# Patient Record
Sex: Female | Born: 1935 | Race: White | Hispanic: No
Health system: Southern US, Community
[De-identification: ages and names within clinical notes are randomized; demographics above are authoritative.]

---

## 2004-04-14 ENCOUNTER — Ambulatory Visit: Payer: Self-pay | Admitting: Family Medicine

## 2004-04-25 ENCOUNTER — Ambulatory Visit: Payer: Self-pay | Admitting: Family Medicine

## 2004-12-12 ENCOUNTER — Ambulatory Visit: Payer: Self-pay | Admitting: Family Medicine

## 2005-09-13 ENCOUNTER — Ambulatory Visit: Payer: Self-pay | Admitting: Pain Medicine

## 2005-09-27 ENCOUNTER — Ambulatory Visit: Payer: Self-pay | Admitting: Pain Medicine

## 2005-10-04 ENCOUNTER — Ambulatory Visit: Payer: Self-pay | Admitting: Pain Medicine

## 2005-11-01 ENCOUNTER — Ambulatory Visit: Payer: Self-pay | Admitting: Pain Medicine

## 2005-11-16 ENCOUNTER — Ambulatory Visit: Payer: Self-pay | Admitting: Pain Medicine

## 2005-11-30 ENCOUNTER — Ambulatory Visit: Payer: Self-pay | Admitting: Physician Assistant

## 2006-02-02 ENCOUNTER — Ambulatory Visit: Payer: Self-pay | Admitting: Internal Medicine

## 2007-03-28 ENCOUNTER — Ambulatory Visit: Payer: Self-pay | Admitting: Family Medicine

## 2007-04-15 ENCOUNTER — Other Ambulatory Visit: Payer: Self-pay

## 2007-04-16 ENCOUNTER — Inpatient Hospital Stay: Payer: Self-pay | Admitting: Surgery

## 2007-04-18 ENCOUNTER — Other Ambulatory Visit: Payer: Self-pay

## 2008-02-24 ENCOUNTER — Ambulatory Visit: Payer: Self-pay | Admitting: Pain Medicine

## 2008-03-05 ENCOUNTER — Ambulatory Visit: Payer: Self-pay | Admitting: Pain Medicine

## 2008-03-24 ENCOUNTER — Ambulatory Visit: Payer: Self-pay | Admitting: Physician Assistant

## 2008-03-30 ENCOUNTER — Ambulatory Visit: Payer: Self-pay | Admitting: Internal Medicine

## 2008-04-09 ENCOUNTER — Ambulatory Visit: Payer: Self-pay | Admitting: Pain Medicine

## 2008-04-30 ENCOUNTER — Ambulatory Visit: Payer: Self-pay | Admitting: Pain Medicine

## 2008-05-18 ENCOUNTER — Ambulatory Visit: Payer: Self-pay | Admitting: Pain Medicine

## 2008-05-28 ENCOUNTER — Ambulatory Visit: Payer: Self-pay | Admitting: Pain Medicine

## 2008-06-09 ENCOUNTER — Ambulatory Visit: Payer: Self-pay | Admitting: Physician Assistant

## 2008-06-22 ENCOUNTER — Ambulatory Visit: Payer: Self-pay | Admitting: Pain Medicine

## 2008-07-06 ENCOUNTER — Ambulatory Visit: Payer: Self-pay | Admitting: Physician Assistant

## 2008-08-12 ENCOUNTER — Ambulatory Visit: Payer: Self-pay | Admitting: Physician Assistant

## 2009-04-01 ENCOUNTER — Ambulatory Visit: Payer: Self-pay | Admitting: Internal Medicine

## 2010-06-29 ENCOUNTER — Ambulatory Visit: Payer: Self-pay

## 2010-10-19 ENCOUNTER — Ambulatory Visit: Payer: Self-pay | Admitting: Internal Medicine

## 2011-07-02 ENCOUNTER — Emergency Department: Payer: Self-pay | Admitting: Emergency Medicine

## 2011-07-02 ENCOUNTER — Ambulatory Visit: Payer: Self-pay | Admitting: Family Medicine

## 2011-07-02 LAB — URINALYSIS, COMPLETE
Bilirubin,UR: NEGATIVE
Glucose,UR: NEGATIVE mg/dL (ref 0–75)
Ketone: NEGATIVE
Nitrite: NEGATIVE
Ph: 8 (ref 4.5–8.0)
Protein: NEGATIVE
Specific Gravity: 1.005 (ref 1.003–1.030)
Squamous Epithelial: 1

## 2011-07-02 LAB — CBC
HCT: 40.4 % (ref 35.0–47.0)
MCH: 30.8 pg (ref 26.0–34.0)
MCV: 93 fL (ref 80–100)
Platelet: 264 10*3/uL (ref 150–440)
RBC: 4.37 10*6/uL (ref 3.80–5.20)
WBC: 6.5 10*3/uL (ref 3.6–11.0)

## 2011-07-02 LAB — COMPREHENSIVE METABOLIC PANEL
Albumin: 3.6 g/dL (ref 3.4–5.0)
Alkaline Phosphatase: 950 U/L — ABNORMAL HIGH (ref 50–136)
Anion Gap: 9 (ref 7–16)
Calcium, Total: 8.9 mg/dL (ref 8.5–10.1)
Co2: 28 mmol/L (ref 21–32)
EGFR (African American): >60
EGFR (Non-African Amer.): 52 — ABNORMAL LOW
Glucose: 104 mg/dL — ABNORMAL HIGH (ref 65–99)
Osmolality: 269 (ref 275–301)
Potassium: 3.7 mmol/L (ref 3.5–5.1)
SGOT(AST): 62 U/L — ABNORMAL HIGH (ref 15–37)
Sodium: 133 mmol/L — ABNORMAL LOW (ref 136–145)
Total Protein: 7.4 g/dL (ref 6.4–8.2)

## 2011-07-02 LAB — TROPONIN I: Troponin-I: <0.02 ng/mL

## 2011-08-12 ENCOUNTER — Ambulatory Visit: Payer: Self-pay | Admitting: Internal Medicine

## 2011-09-05 ENCOUNTER — Ambulatory Visit: Payer: Self-pay | Admitting: Physical Medicine and Rehabilitation

## 2011-09-07 ENCOUNTER — Ambulatory Visit: Payer: Self-pay | Admitting: Internal Medicine

## 2011-09-11 ENCOUNTER — Ambulatory Visit: Payer: Self-pay | Admitting: Hematology and Oncology

## 2011-09-20 ENCOUNTER — Ambulatory Visit: Payer: Self-pay | Admitting: Ophthalmology

## 2011-09-25 ENCOUNTER — Ambulatory Visit: Payer: Self-pay | Admitting: Hematology and Oncology

## 2011-09-25 ENCOUNTER — Ambulatory Visit: Payer: Self-pay

## 2011-09-25 LAB — CBC CANCER CENTER
Basophil %: 0.8 %
Eosinophil %: 2.3 %
HCT: 37.4 % (ref 35.0–47.0)
HGB: 12.2 g/dL (ref 12.0–16.0)
Lymphocyte %: 23.5 %
Monocyte #: 0.6 x10 3/mm (ref 0.2–0.9)
Monocyte %: 8.4 %
Neutrophil #: 4.5 x10 3/mm (ref 1.4–6.5)
Neutrophil %: 65 %
RBC: 3.99 10*6/uL (ref 3.80–5.20)
WBC: 7 x10 3/mm (ref 3.6–11.0)

## 2011-09-25 LAB — COMPREHENSIVE METABOLIC PANEL
Anion Gap: 5 — ABNORMAL LOW (ref 7–16)
Calcium, Total: 9.1 mg/dL (ref 8.5–10.1)
Chloride: 100 mmol/L (ref 98–107)
Co2: 34 mmol/L — ABNORMAL HIGH (ref 21–32)
EGFR (African American): 48 — ABNORMAL LOW
EGFR (Non-African Amer.): 42 — ABNORMAL LOW
Osmolality: 284 (ref 275–301)
Potassium: 4.2 mmol/L (ref 3.5–5.1)
Sodium: 139 mmol/L (ref 136–145)

## 2011-09-26 LAB — KAPPA/LAMBDA FREE LIGHT CHAINS (ARMC)

## 2011-09-28 LAB — PROT IMMUNOELECT,UR-24HR

## 2011-10-04 ENCOUNTER — Emergency Department: Payer: Self-pay | Admitting: Emergency Medicine

## 2011-10-07 ENCOUNTER — Emergency Department: Payer: Self-pay | Admitting: Emergency Medicine

## 2011-10-07 LAB — COMPREHENSIVE METABOLIC PANEL
Albumin: 2.8 g/dL — ABNORMAL LOW (ref 3.4–5.0)
Alkaline Phosphatase: 592 U/L — ABNORMAL HIGH (ref 50–136)
Bilirubin,Total: 0.7 mg/dL (ref 0.2–1.0)
Calcium, Total: 8.2 mg/dL — ABNORMAL LOW (ref 8.5–10.1)
Co2: 30 mmol/L (ref 21–32)
Creatinine: 1.87 mg/dL — ABNORMAL HIGH (ref 0.60–1.30)
EGFR (Non-African Amer.): 24 — ABNORMAL LOW
Glucose: 134 mg/dL — ABNORMAL HIGH (ref 65–99)
SGPT (ALT): 20 U/L (ref 12–78)

## 2011-10-07 LAB — CBC
HCT: 31.1 % — ABNORMAL LOW (ref 35.0–47.0)
MCHC: 33.6 g/dL (ref 32.0–36.0)
MCV: 91 fL (ref 80–100)
RDW: 13.7 % (ref 11.5–14.5)

## 2011-10-09 LAB — COMPREHENSIVE METABOLIC PANEL
Albumin: 2.7 g/dL — ABNORMAL LOW (ref 3.4–5.0)
Alkaline Phosphatase: 725 U/L — ABNORMAL HIGH (ref 50–136)
Bilirubin,Total: 0.6 mg/dL (ref 0.2–1.0)
Calcium, Total: 8.3 mg/dL — ABNORMAL LOW (ref 8.5–10.1)
Co2: 29 mmol/L (ref 21–32)
Glucose: 108 mg/dL — ABNORMAL HIGH (ref 65–99)
SGOT(AST): 46 U/L — ABNORMAL HIGH (ref 15–37)

## 2011-10-09 LAB — APTT: Activated PTT: 28 secs (ref 23.6–35.9)

## 2011-10-09 LAB — TSH: Thyroid Stimulating Horm: 1.17 u[IU]/mL

## 2011-10-09 LAB — CBC WITH DIFFERENTIAL/PLATELET
Basophil #: 0.1 10*3/uL (ref 0.0–0.1)
Eosinophil #: 0 10*3/uL (ref 0.0–0.7)
HCT: 30.5 % — ABNORMAL LOW (ref 35.0–47.0)
HGB: 9.9 g/dL — ABNORMAL LOW (ref 12.0–16.0)
Lymphocyte %: 12.8 %
MCH: 29.4 pg (ref 26.0–34.0)
MCHC: 32.4 g/dL (ref 32.0–36.0)
Monocyte #: 1 x10 3/mm — ABNORMAL HIGH (ref 0.2–0.9)
Monocyte %: 9.2 %
Neutrophil #: 8.4 10*3/uL — ABNORMAL HIGH (ref 1.4–6.5)
RBC: 3.36 10*6/uL — ABNORMAL LOW (ref 3.80–5.20)
WBC: 10.9 10*3/uL (ref 3.6–11.0)

## 2011-10-09 LAB — MAGNESIUM: Magnesium: 2.1 mg/dL

## 2011-10-09 LAB — PROTIME-INR: INR: 1

## 2011-10-10 LAB — BASIC METABOLIC PANEL
Calcium, Total: 8.3 mg/dL — ABNORMAL LOW (ref 8.5–10.1)
Chloride: 105 mmol/L (ref 98–107)
Co2: 24 mmol/L (ref 21–32)
Creatinine: 1.07 mg/dL (ref 0.60–1.30)
EGFR (African American): 55 — ABNORMAL LOW
EGFR (Non-African Amer.): 47 — ABNORMAL LOW
Osmolality: 285 (ref 275–301)
Potassium: 3.8 mmol/L (ref 3.5–5.1)
Sodium: 139 mmol/L (ref 136–145)

## 2011-10-10 LAB — URINALYSIS, COMPLETE
Bacteria: NONE SEEN
Blood: NEGATIVE
Glucose,UR: NEGATIVE mg/dL (ref 0–75)
Leukocyte Esterase: NEGATIVE
Nitrite: NEGATIVE
Protein: NEGATIVE
RBC,UR: NONE SEEN /HPF (ref 0–5)
Specific Gravity: 1.012 (ref 1.003–1.030)
WBC UR: NONE SEEN /HPF (ref 0–5)

## 2011-10-12 ENCOUNTER — Inpatient Hospital Stay: Payer: Self-pay | Admitting: Internal Medicine

## 2011-10-12 LAB — BASIC METABOLIC PANEL
BUN: 23 mg/dL — ABNORMAL HIGH (ref 7–18)
Calcium, Total: 7.7 mg/dL — ABNORMAL LOW (ref 8.5–10.1)
Creatinine: 0.97 mg/dL (ref 0.60–1.30)
EGFR (African American): >60
EGFR (Non-African Amer.): 53 — ABNORMAL LOW
Potassium: 4.4 mmol/L (ref 3.5–5.1)
Sodium: 140 mmol/L (ref 136–145)

## 2011-10-12 LAB — CBC WITH DIFFERENTIAL/PLATELET
Basophil #: 0.1 10*3/uL (ref 0.0–0.1)
Basophil %: 0.6 %
Eosinophil #: 0.4 10*3/uL (ref 0.0–0.7)
Lymphocyte #: 2 10*3/uL (ref 1.0–3.6)
MCH: 30.2 pg (ref 26.0–34.0)
MCV: 92 fL (ref 80–100)
Neutrophil %: 66 %
Platelet: 508 10*3/uL — ABNORMAL HIGH (ref 150–440)
RDW: 13.8 % (ref 11.5–14.5)

## 2011-10-16 ENCOUNTER — Encounter: Payer: Self-pay | Admitting: Internal Medicine

## 2011-10-17 ENCOUNTER — Ambulatory Visit: Payer: Self-pay | Admitting: Hematology and Oncology

## 2011-10-17 ENCOUNTER — Encounter: Payer: Self-pay | Admitting: Internal Medicine

## 2011-10-30 LAB — CBC CANCER CENTER
Basophil #: 0.1 x10 3/mm (ref 0.0–0.1)
Eosinophil #: 0.1 x10 3/mm (ref 0.0–0.7)
Eosinophil %: 1.2 %
HCT: 33.6 % — ABNORMAL LOW (ref 35.0–47.0)
Lymphocyte #: 1.2 x10 3/mm (ref 1.0–3.6)
MCH: 30.4 pg (ref 26.0–34.0)
MCHC: 32.5 g/dL (ref 32.0–36.0)
MCV: 94 fL (ref 80–100)
Monocyte #: 0.4 x10 3/mm (ref 0.2–0.9)
Neutrophil #: 4.5 x10 3/mm (ref 1.4–6.5)
Neutrophil %: 72.5 %
Platelet: 359 x10 3/mm (ref 150–440)
RBC: 3.59 10*6/uL — ABNORMAL LOW (ref 3.80–5.20)
RDW: 15.1 % — ABNORMAL HIGH (ref 11.5–14.5)

## 2011-10-30 LAB — COMPREHENSIVE METABOLIC PANEL
Alkaline Phosphatase: 661 U/L — ABNORMAL HIGH (ref 50–136)
BUN: 23 mg/dL — ABNORMAL HIGH (ref 7–18)
Bilirubin,Total: 0.2 mg/dL (ref 0.2–1.0)
Chloride: 104 mmol/L (ref 98–107)
Creatinine: 1.01 mg/dL (ref 0.60–1.30)
EGFR (African American): 58 — ABNORMAL LOW
Glucose: 148 mg/dL — ABNORMAL HIGH (ref 65–99)
Potassium: 4.6 mmol/L (ref 3.5–5.1)
SGOT(AST): 41 U/L — ABNORMAL HIGH (ref 15–37)
SGPT (ALT): 23 U/L (ref 12–78)
Total Protein: 6.1 g/dL — ABNORMAL LOW (ref 6.4–8.2)

## 2011-11-17 ENCOUNTER — Ambulatory Visit: Payer: Self-pay | Admitting: Hematology and Oncology

## 2011-11-27 LAB — COMPREHENSIVE METABOLIC PANEL WITH GFR
Albumin: 3.2 g/dL — ABNORMAL LOW
Alkaline Phosphatase: 391 U/L — ABNORMAL HIGH
Anion Gap: 9
BUN: 29 mg/dL — ABNORMAL HIGH
Bilirubin,Total: 0.2 mg/dL
Calcium, Total: 8.3 mg/dL — ABNORMAL LOW
Chloride: 102 mmol/L
Co2: 31 mmol/L
Creatinine: 1.05 mg/dL
EGFR (African American): 56 — ABNORMAL LOW
EGFR (Non-African Amer.): 48 — ABNORMAL LOW
Glucose: 98 mg/dL
Osmolality: 289
Potassium: 3.3 mmol/L — ABNORMAL LOW
SGOT(AST): 46 U/L — ABNORMAL HIGH
SGPT (ALT): 21 U/L
Sodium: 142 mmol/L
Total Protein: 6.8 g/dL

## 2011-11-27 LAB — CBC CANCER CENTER
Basophil #: 0 10*3/uL
Basophil %: 0.7 %
Eosinophil #: 0 10*3/uL
Eosinophil %: 0.7 %
HCT: 35.8 %
HGB: 11.6 g/dL — ABNORMAL LOW
Lymphocyte %: 22.6 %
Lymphs Abs: 1.4 10*3/uL
MCH: 29.3 pg
MCHC: 32.5 g/dL
MCV: 90 fL
Monocyte #: 0.4 10*3/uL
Monocyte %: 7.2 %
Neutrophil #: 4.2 10*3/uL
Neutrophil %: 68.8 %
Platelet: 268 10*3/uL
RBC: 3.97 10*6/uL
RDW: 14.7 % — ABNORMAL HIGH
WBC: 6.2 10*3/uL

## 2011-12-17 ENCOUNTER — Ambulatory Visit: Payer: Self-pay | Admitting: Hematology and Oncology

## 2011-12-19 ENCOUNTER — Ambulatory Visit: Payer: Self-pay | Admitting: Hematology and Oncology

## 2011-12-19 LAB — LACTATE DEHYDROGENASE, PLEURAL OR PERITONEAL FLUID: LDH, Body Fluid: 276 U/L

## 2011-12-19 LAB — PROTIME-INR: Prothrombin Time: 13.8 secs (ref 11.5–14.7)

## 2011-12-19 LAB — AMYLASE, BODY FLUID: Amylase, Body Fluid: 18 U/L

## 2012-01-01 ENCOUNTER — Inpatient Hospital Stay: Payer: Self-pay | Admitting: Internal Medicine

## 2012-01-01 LAB — CBC CANCER CENTER
Basophil #: 0 x10 3/mm (ref 0.0–0.1)
Basophil %: 0.8 %
Eosinophil #: 0.2 x10 3/mm (ref 0.0–0.7)
Eosinophil %: 4.4 %
MCH: 27.5 pg (ref 26.0–34.0)
Monocyte %: 8.7 %
Neutrophil %: 73.2 %
Platelet: 303 x10 3/mm (ref 150–440)
RBC: 4.06 10*6/uL (ref 3.80–5.20)
RDW: 15.6 % — ABNORMAL HIGH (ref 11.5–14.5)
WBC: 5.6 x10 3/mm (ref 3.6–11.0)

## 2012-01-01 LAB — COMPREHENSIVE METABOLIC PANEL
Albumin: 2.6 g/dL — ABNORMAL LOW (ref 3.4–5.0)
Alkaline Phosphatase: 300 U/L — ABNORMAL HIGH (ref 50–136)
Anion Gap: 4 — ABNORMAL LOW (ref 7–16)
BUN: 26 mg/dL — ABNORMAL HIGH (ref 7–18)
Bilirubin,Total: 0.3 mg/dL (ref 0.2–1.0)
Co2: 33 mmol/L — ABNORMAL HIGH (ref 21–32)
Creatinine: 0.96 mg/dL (ref 0.60–1.30)
EGFR (Non-African Amer.): 54 — ABNORMAL LOW
Osmolality: 283 (ref 275–301)
Potassium: 3.9 mmol/L (ref 3.5–5.1)
SGPT (ALT): 18 U/L (ref 12–78)
Sodium: 139 mmol/L (ref 136–145)

## 2012-01-02 LAB — CBC WITH DIFFERENTIAL/PLATELET
Basophil %: 0.9 %
Eosinophil #: 0.4 10*3/uL (ref 0.0–0.7)
HCT: 31.1 % — ABNORMAL LOW (ref 35.0–47.0)
HGB: 10.1 g/dL — ABNORMAL LOW (ref 12.0–16.0)
Lymphocyte %: 14.7 %
MCH: 28 pg (ref 26.0–34.0)
MCHC: 32.4 g/dL (ref 32.0–36.0)
Monocyte #: 0.6 x10 3/mm (ref 0.2–0.9)
Monocyte %: 8.3 %
Neutrophil %: 70.8 %
RBC: 3.59 10*6/uL — ABNORMAL LOW (ref 3.80–5.20)
RDW: 16.3 % — ABNORMAL HIGH (ref 11.5–14.5)
WBC: 7 10*3/uL (ref 3.6–11.0)

## 2012-01-02 LAB — BASIC METABOLIC PANEL
Anion Gap: 5 — ABNORMAL LOW (ref 7–16)
BUN: 24 mg/dL — ABNORMAL HIGH (ref 7–18)
Chloride: 108 mmol/L — ABNORMAL HIGH (ref 98–107)
Co2: 28 mmol/L (ref 21–32)
Creatinine: 0.79 mg/dL (ref 0.60–1.30)
EGFR (African American): >60
Glucose: 88 mg/dL (ref 65–99)
Sodium: 141 mmol/L (ref 136–145)

## 2012-01-04 LAB — CBC WITH DIFFERENTIAL/PLATELET
Eosinophil: 6 %
HCT: 32 % — ABNORMAL LOW (ref 35.0–47.0)
Lymphocytes: 14 %
MCH: 27.6 pg (ref 26.0–34.0)
MCHC: 31.5 g/dL — ABNORMAL LOW (ref 32.0–36.0)
Monocytes: 9 %
Platelet: 251 10*3/uL (ref 150–440)
RDW: 16.8 % — ABNORMAL HIGH (ref 11.5–14.5)
Segmented Neutrophils: 70 %
Variant Lymphocyte - H1-Rlymph: 1 %

## 2012-01-04 LAB — BASIC METABOLIC PANEL
Anion Gap: 4 — ABNORMAL LOW (ref 7–16)
BUN: 25 mg/dL — ABNORMAL HIGH (ref 7–18)
Calcium, Total: 7.9 mg/dL — ABNORMAL LOW (ref 8.5–10.1)
Creatinine: 0.75 mg/dL (ref 0.60–1.30)
EGFR (African American): >60
EGFR (Non-African Amer.): >60
Osmolality: 274 (ref 275–301)
Potassium: 3.8 mmol/L (ref 3.5–5.1)
Sodium: 135 mmol/L — ABNORMAL LOW (ref 136–145)

## 2012-01-05 LAB — URINE CULTURE

## 2012-01-08 LAB — APTT: Activated PTT: 29.4 secs (ref 23.6–35.9)

## 2012-01-08 LAB — PLATELET COUNT: Platelet: 283 10*3/uL (ref 150–440)

## 2012-01-08 LAB — PROTIME-INR: INR: 0.9

## 2012-01-11 LAB — BASIC METABOLIC PANEL
Anion Gap: 5 — ABNORMAL LOW (ref 7–16)
BUN: 29 mg/dL — ABNORMAL HIGH (ref 7–18)
Calcium, Total: 7.8 mg/dL — ABNORMAL LOW (ref 8.5–10.1)
Co2: 29 mmol/L (ref 21–32)
EGFR (African American): >60
EGFR (Non-African Amer.): 55 — ABNORMAL LOW
Osmolality: 281 (ref 275–301)
Potassium: 4.5 mmol/L (ref 3.5–5.1)
Sodium: 138 mmol/L (ref 136–145)

## 2012-01-13 ENCOUNTER — Encounter: Payer: Self-pay | Admitting: Internal Medicine

## 2012-01-17 ENCOUNTER — Encounter: Payer: Self-pay | Admitting: Internal Medicine

## 2012-01-17 ENCOUNTER — Ambulatory Visit: Payer: Self-pay | Admitting: Hematology and Oncology

## 2012-02-05 LAB — BASIC METABOLIC PANEL
BUN: 25 mg/dL — ABNORMAL HIGH (ref 7–18)
Calcium, Total: 8 mg/dL — ABNORMAL LOW (ref 8.5–10.1)
Chloride: 104 mmol/L (ref 98–107)
Co2: 30 mmol/L (ref 21–32)
Creatinine: 1.26 mg/dL (ref 0.60–1.30)
EGFR (African American): 45 — ABNORMAL LOW
Osmolality: 287 (ref 275–301)
Sodium: 141 mmol/L (ref 136–145)

## 2012-02-05 LAB — CBC CANCER CENTER
Basophil %: 1.3 %
Eosinophil #: 0.7 x10 3/mm (ref 0.0–0.7)
Eosinophil %: 9.1 %
HGB: 10.4 g/dL — ABNORMAL LOW (ref 12.0–16.0)
MCH: 27.3 pg (ref 26.0–34.0)
MCHC: 32.2 g/dL (ref 32.0–36.0)
MCV: 85 fL (ref 80–100)
Monocyte #: 0.6 x10 3/mm (ref 0.2–0.9)
Monocyte %: 8.7 %
Neutrophil #: 4.9 x10 3/mm (ref 1.4–6.5)
Neutrophil %: 66.6 %
Platelet: 345 x10 3/mm (ref 150–440)
RBC: 3.82 10*6/uL (ref 3.80–5.20)

## 2012-02-17 ENCOUNTER — Ambulatory Visit: Payer: Self-pay | Admitting: Hematology and Oncology

## 2012-02-17 ENCOUNTER — Encounter: Payer: Self-pay | Admitting: Internal Medicine

## 2012-02-21 ENCOUNTER — Encounter: Payer: Self-pay | Admitting: Internal Medicine

## 2012-03-16 ENCOUNTER — Ambulatory Visit: Payer: Self-pay | Admitting: Hematology and Oncology

## 2012-03-16 ENCOUNTER — Encounter: Payer: Self-pay | Admitting: Internal Medicine

## 2012-03-25 ENCOUNTER — Ambulatory Visit: Payer: Self-pay | Admitting: Hematology and Oncology

## 2012-03-25 LAB — CBC CANCER CENTER
Basophil #: 0 x10 3/mm (ref 0.0–0.1)
HCT: 35.6 % (ref 35.0–47.0)
HGB: 11.4 g/dL — ABNORMAL LOW (ref 12.0–16.0)
Lymphocyte %: 13.8 %
MCH: 28.3 pg (ref 26.0–34.0)
MCHC: 32 g/dL (ref 32.0–36.0)
Monocyte #: 0.5 x10 3/mm (ref 0.2–0.9)
Monocyte %: 6.7 %
Neutrophil #: 5.9 x10 3/mm (ref 1.4–6.5)
RBC: 4.03 10*6/uL (ref 3.80–5.20)
WBC: 7.6 x10 3/mm (ref 3.6–11.0)

## 2012-03-25 LAB — COMPREHENSIVE METABOLIC PANEL
Alkaline Phosphatase: 261 U/L — ABNORMAL HIGH (ref 50–136)
Anion Gap: 8 (ref 7–16)
BUN: 24 mg/dL — ABNORMAL HIGH (ref 7–18)
Co2: 32 mmol/L (ref 21–32)
Creatinine: 1.06 mg/dL (ref 0.60–1.30)
EGFR (Non-African Amer.): 48 — ABNORMAL LOW
SGPT (ALT): 19 U/L (ref 12–78)
Sodium: 142 mmol/L (ref 136–145)

## 2012-04-16 ENCOUNTER — Ambulatory Visit: Payer: Self-pay | Admitting: Hematology and Oncology

## 2012-04-16 ENCOUNTER — Encounter: Payer: Self-pay | Admitting: Internal Medicine

## 2012-04-29 LAB — BASIC METABOLIC PANEL
Chloride: 102 mmol/L (ref 98–107)
Co2: 34 mmol/L — ABNORMAL HIGH (ref 21–32)
Glucose: 114 mg/dL — ABNORMAL HIGH (ref 65–99)
Osmolality: 285 (ref 275–301)
Potassium: 4 mmol/L (ref 3.5–5.1)

## 2012-04-29 LAB — CBC CANCER CENTER
Eosinophil %: 2 %
HCT: 36.5 % (ref 35.0–47.0)
HGB: 11.6 g/dL — ABNORMAL LOW (ref 12.0–16.0)
MCHC: 31.9 g/dL — ABNORMAL LOW (ref 32.0–36.0)
Monocyte #: 0.5 x10 3/mm (ref 0.2–0.9)
Neutrophil #: 4.5 x10 3/mm (ref 1.4–6.5)
Platelet: 200 x10 3/mm (ref 150–440)
RDW: 14.7 % — ABNORMAL HIGH (ref 11.5–14.5)

## 2012-05-16 ENCOUNTER — Ambulatory Visit: Payer: Self-pay | Admitting: Hematology and Oncology

## 2012-05-31 LAB — CBC CANCER CENTER
Basophil #: 0.1 x10 3/mm (ref 0.0–0.1)
Basophil %: 0.7 %
Eosinophil #: 0 x10 3/mm (ref 0.0–0.7)
Eosinophil %: 0.4 %
HGB: 13.2 g/dL (ref 12.0–16.0)
Lymphocyte %: 10.3 %
MCHC: 32.6 g/dL (ref 32.0–36.0)
MCV: 85 fL (ref 80–100)
Monocyte #: 0.4 x10 3/mm (ref 0.2–0.9)
Monocyte %: 5.9 %
Neutrophil %: 82.7 %
RDW: 15.6 % — ABNORMAL HIGH (ref 11.5–14.5)

## 2012-05-31 LAB — COMPREHENSIVE METABOLIC PANEL
Albumin: 3.4 g/dL (ref 3.4–5.0)
Alkaline Phosphatase: 416 U/L — ABNORMAL HIGH (ref 50–136)
Anion Gap: 9 (ref 7–16)
BUN: 20 mg/dL — ABNORMAL HIGH (ref 7–18)
Bilirubin,Total: 0.4 mg/dL (ref 0.2–1.0)
Creatinine: 0.91 mg/dL (ref 0.60–1.30)
EGFR (African American): >60
Glucose: 106 mg/dL — ABNORMAL HIGH (ref 65–99)
SGOT(AST): 45 U/L — ABNORMAL HIGH (ref 15–37)
SGPT (ALT): 21 U/L (ref 12–78)

## 2012-06-14 LAB — BASIC METABOLIC PANEL
BUN: 27 mg/dL — ABNORMAL HIGH (ref 7–18)
Calcium, Total: 8 mg/dL — ABNORMAL LOW (ref 8.5–10.1)
Chloride: 99 mmol/L (ref 98–107)
Creatinine: 1.07 mg/dL (ref 0.60–1.30)
EGFR (Non-African Amer.): 47 — ABNORMAL LOW
Potassium: 4.4 mmol/L (ref 3.5–5.1)

## 2012-06-14 LAB — CBC CANCER CENTER
Basophil #: 0 x10 3/mm (ref 0.0–0.1)
Eosinophil #: 0 x10 3/mm (ref 0.0–0.7)
Eosinophil %: 0.1 %
HCT: 35.4 % (ref 35.0–47.0)
HGB: 11.3 g/dL — ABNORMAL LOW (ref 12.0–16.0)
Lymphocyte #: 0.6 x10 3/mm — ABNORMAL LOW (ref 1.0–3.6)
Lymphocyte %: 4.9 %
MCH: 27.7 pg (ref 26.0–34.0)
MCHC: 31.9 g/dL — ABNORMAL LOW (ref 32.0–36.0)
Monocyte #: 0.2 x10 3/mm (ref 0.2–0.9)
Monocyte %: 2 %
Neutrophil %: 92.6 %
Platelet: 254 x10 3/mm (ref 150–440)
RBC: 4.09 10*6/uL (ref 3.80–5.20)
RDW: 16.6 % — ABNORMAL HIGH (ref 11.5–14.5)

## 2012-06-16 ENCOUNTER — Ambulatory Visit: Payer: Self-pay | Admitting: Hematology and Oncology

## 2012-07-08 LAB — BASIC METABOLIC PANEL
Calcium, Total: 7.8 mg/dL — ABNORMAL LOW (ref 8.5–10.1)
Chloride: 98 mmol/L (ref 98–107)
Co2: 32 mmol/L (ref 21–32)
EGFR (African American): 56 — ABNORMAL LOW
Potassium: 3.7 mmol/L (ref 3.5–5.1)

## 2012-07-08 LAB — CBC CANCER CENTER
Basophil #: 0.1 x10 3/mm (ref 0.0–0.1)
Basophil %: 0.8 %
Eosinophil #: 0.2 x10 3/mm (ref 0.0–0.7)
Eosinophil %: 2.1 %
HGB: 9.7 g/dL — ABNORMAL LOW (ref 12.0–16.0)
Lymphocyte #: 1 x10 3/mm (ref 1.0–3.6)
Lymphocyte %: 9.5 %
MCV: 89 fL (ref 80–100)
Monocyte #: 0.4 x10 3/mm (ref 0.2–0.9)
Monocyte %: 4.1 %
Neutrophil #: 8.7 x10 3/mm — ABNORMAL HIGH (ref 1.4–6.5)
Platelet: 373 x10 3/mm (ref 150–440)
RDW: 18.7 % — ABNORMAL HIGH (ref 11.5–14.5)

## 2012-07-16 ENCOUNTER — Ambulatory Visit: Payer: Self-pay | Admitting: Hematology and Oncology

## 2012-08-12 LAB — CREATININE, SERUM
Creatinine: 1.18 mg/dL (ref 0.60–1.30)
EGFR (African American): 48 — ABNORMAL LOW
EGFR (Non-African Amer.): 42 — ABNORMAL LOW

## 2012-08-16 ENCOUNTER — Ambulatory Visit: Payer: Self-pay | Admitting: Hematology and Oncology

## 2012-09-09 LAB — COMPREHENSIVE METABOLIC PANEL
Alkaline Phosphatase: 285 U/L — ABNORMAL HIGH (ref 50–136)
Chloride: 101 mmol/L (ref 98–107)
Co2: 34 mmol/L — ABNORMAL HIGH (ref 21–32)
EGFR (Non-African Amer.): 39 — ABNORMAL LOW
SGPT (ALT): 29 U/L (ref 12–78)
Sodium: 142 mmol/L (ref 136–145)

## 2012-09-09 LAB — CBC CANCER CENTER
Basophil %: 0.4 %
Eosinophil #: 0.1 x10 3/mm (ref 0.0–0.7)
Eosinophil %: 0.8 %
HGB: 11.2 g/dL — ABNORMAL LOW (ref 12.0–16.0)
Lymphocyte #: 1.1 x10 3/mm (ref 1.0–3.6)
MCH: 25.8 pg — ABNORMAL LOW (ref 26.0–34.0)
MCV: 84 fL (ref 80–100)
Monocyte #: 0.6 x10 3/mm (ref 0.2–0.9)
Monocyte %: 4.4 %
Platelet: 259 x10 3/mm (ref 150–440)
RBC: 4.36 10*6/uL (ref 3.80–5.20)

## 2012-09-16 ENCOUNTER — Ambulatory Visit: Payer: Self-pay | Admitting: Hematology and Oncology

## 2012-10-07 LAB — CBC CANCER CENTER
Basophil #: 0.1 x10 3/mm (ref 0.0–0.1)
Basophil %: 0.6 %
Eosinophil #: 0.1 x10 3/mm (ref 0.0–0.7)
Eosinophil %: 0.6 %
Lymphocyte #: 1.2 x10 3/mm (ref 1.0–3.6)
Lymphocyte %: 9.8 %
MCH: 25.8 pg — ABNORMAL LOW (ref 26.0–34.0)
MCHC: 31 g/dL — ABNORMAL LOW (ref 32.0–36.0)
Monocyte %: 5.1 %
Neutrophil %: 83.9 %
RBC: 4.2 10*6/uL (ref 3.80–5.20)
WBC: 11.9 x10 3/mm — ABNORMAL HIGH (ref 3.6–11.0)

## 2012-10-07 LAB — BASIC METABOLIC PANEL
BUN: 40 mg/dL — ABNORMAL HIGH (ref 7–18)
Calcium, Total: 8.1 mg/dL — ABNORMAL LOW (ref 8.5–10.1)
Co2: 33 mmol/L — ABNORMAL HIGH (ref 21–32)
Creatinine: 1.36 mg/dL — ABNORMAL HIGH (ref 0.60–1.30)
Glucose: 122 mg/dL — ABNORMAL HIGH (ref 65–99)
Osmolality: 292 (ref 275–301)
Sodium: 141 mmol/L (ref 136–145)

## 2012-10-16 ENCOUNTER — Ambulatory Visit: Payer: Self-pay | Admitting: Hematology and Oncology

## 2012-12-01 ENCOUNTER — Other Ambulatory Visit: Payer: Self-pay | Admitting: Family Medicine

## 2012-12-01 LAB — URINALYSIS, COMPLETE
Glucose,UR: NEGATIVE mg/dL (ref 0–75)
Specific Gravity: 1.009 (ref 1.003–1.030)
Squamous Epithelial: NONE SEEN

## 2012-12-03 LAB — URINE CULTURE

## 2013-01-14 IMAGING — CT CT CHEST W/O CM
1 series · 15 of 32 positions shown, 19 images · non-contrast
Comparison: none

REASON FOR EXAM: left pleural effusion,possible lung mass
COMMENTS:

[Series 2: soft tissue · axial · 0.71mm/px · z∈[-990,-684]mm · 15 of 116 slices shown, 19 images]
[im 9/116  mediastinal]
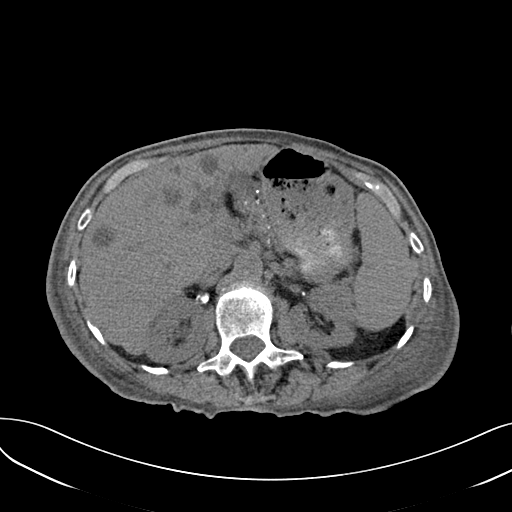
[im 9/116  lung]
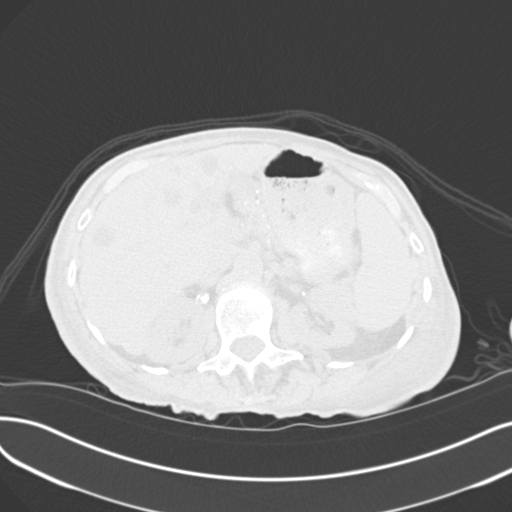
[im 18/116  lung]
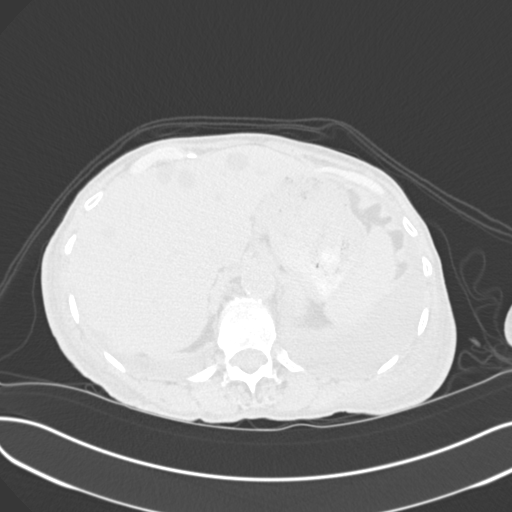
[im 24/116  lung]
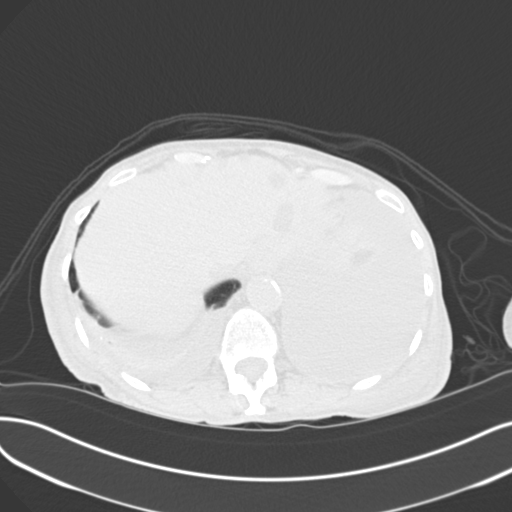
[im 30/116  lung]
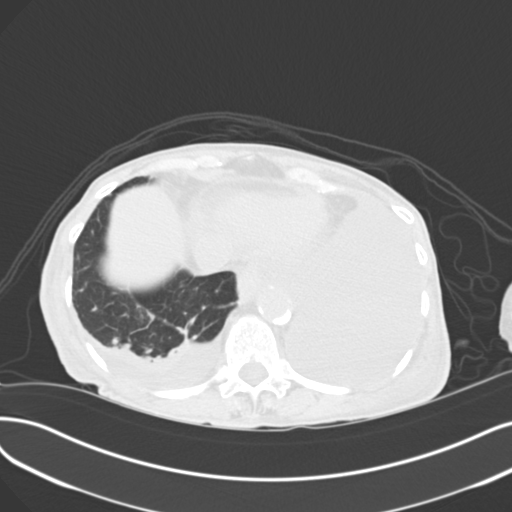
[im 39/116  mediastinal]
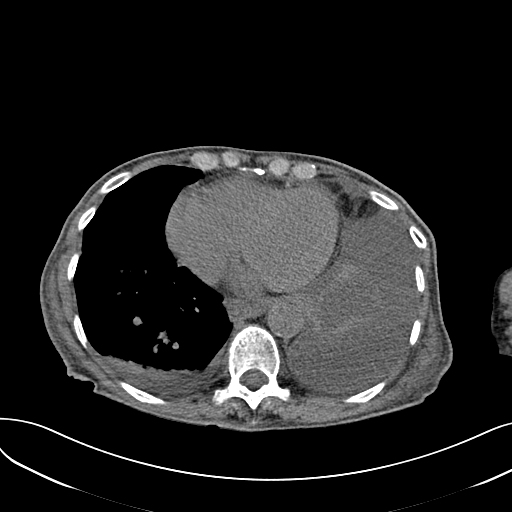
[im 39/116  lung]
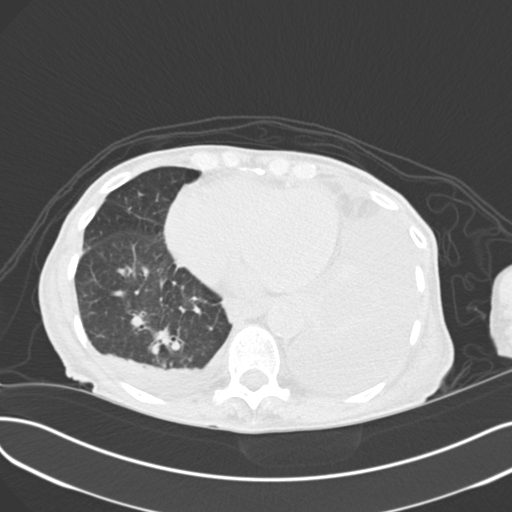
[im 47/116  lung]
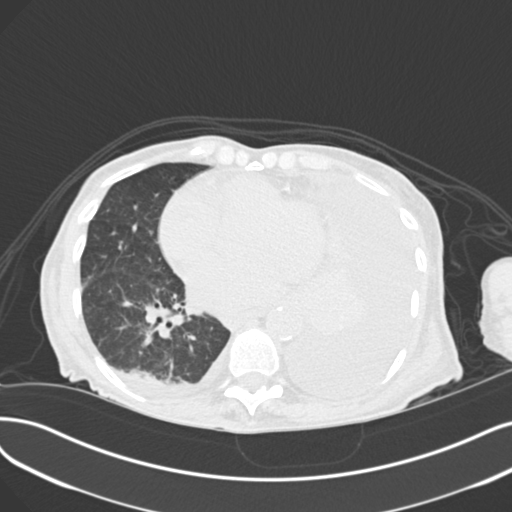
[im 56/116  lung]
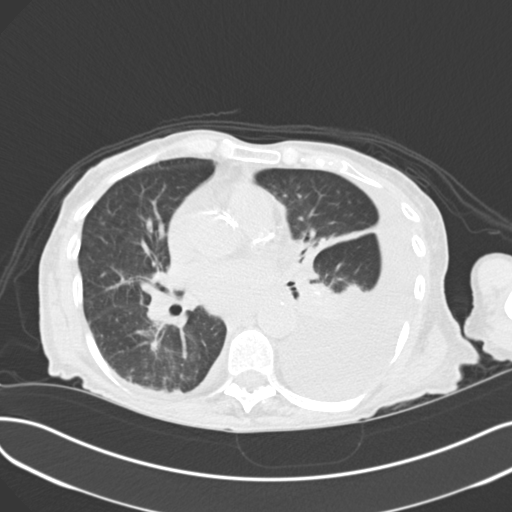
[im 61/116  lung]
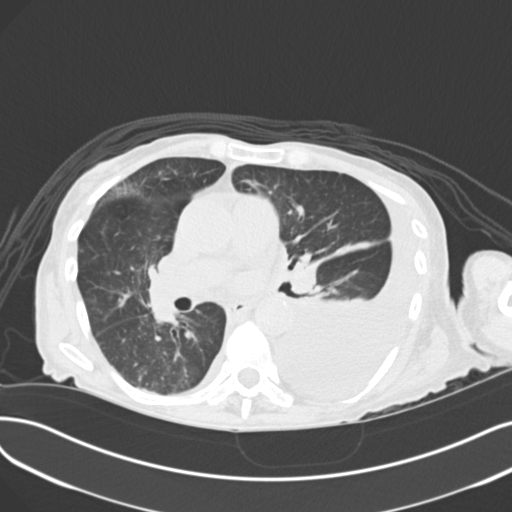
[im 69/116  mediastinal]
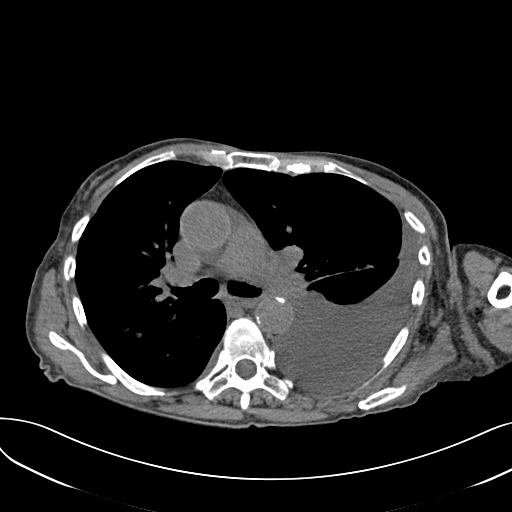
[im 69/116  lung]
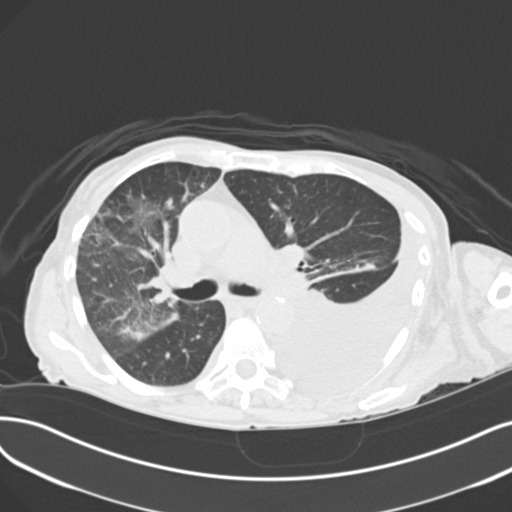
[im 73/116  lung]
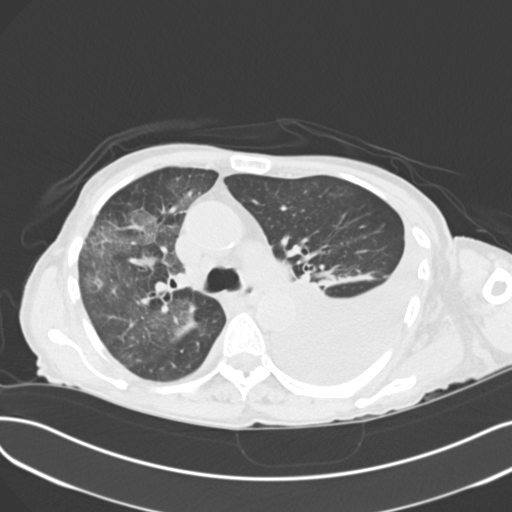
[im 81/116  lung]
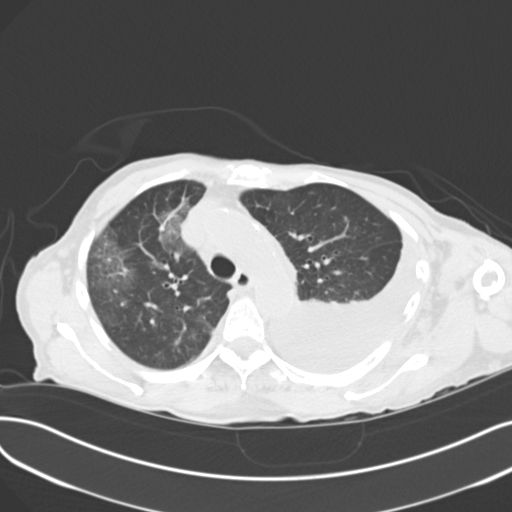
[im 90/116  lung]
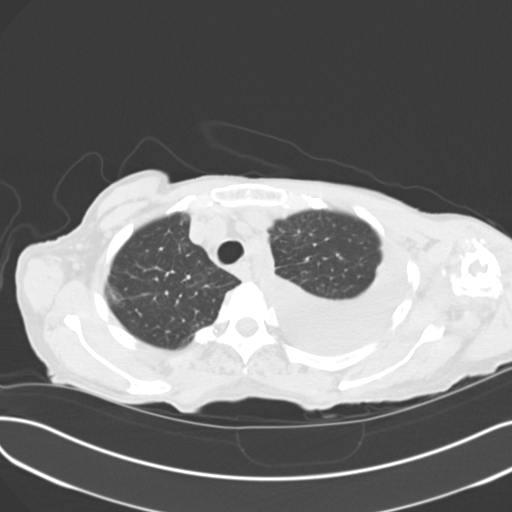
[im 94/116  mediastinal]
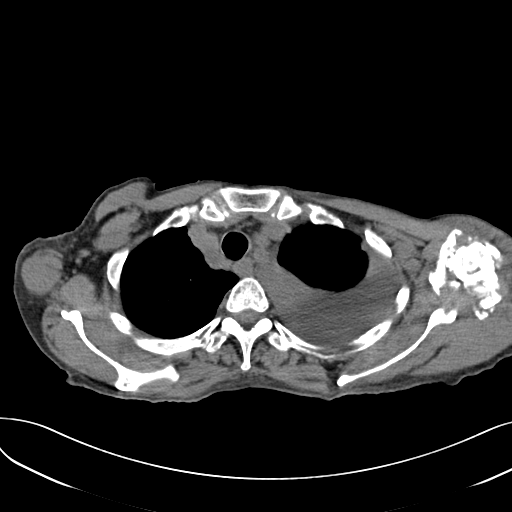
[im 94/116  lung]
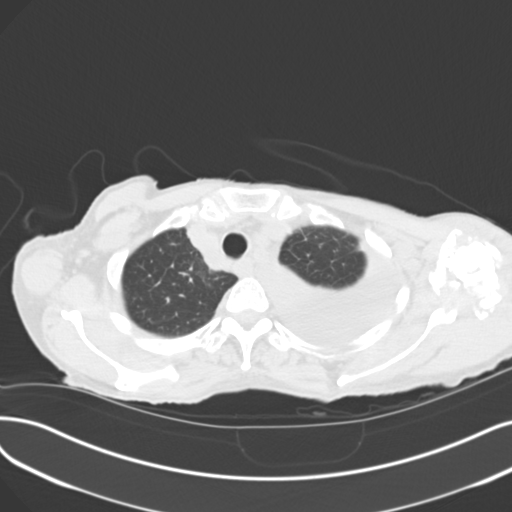
[im 103/116  lung]
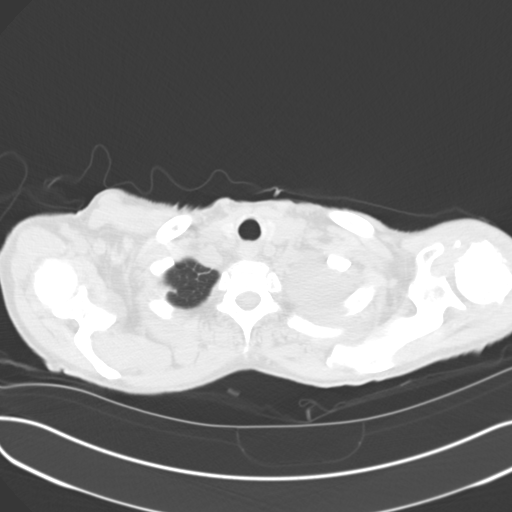
[im 111/116  lung]
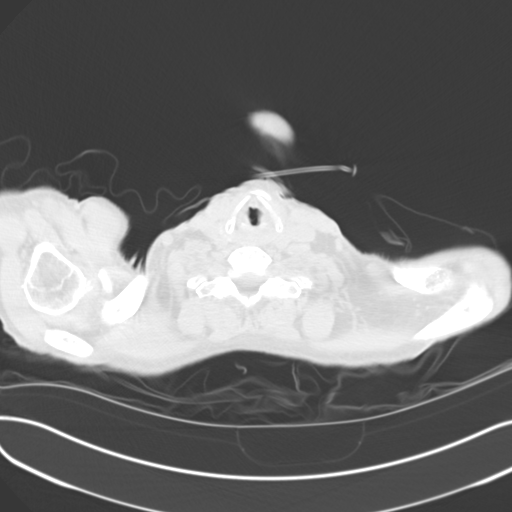

[15 of 32 positions shown; findings below may reference images not displayed]

PROCEDURE:     CT  - CT CHEST WITHOUT CONTRAST  - January 03, 2012 [DATE]

RESULT:     Chest CT without contrast is reconstructed at 3 mm slice
thickness in the axial plane. The patient has no previous similar study for
comparison.

There is a moderately large left pleural effusion. There is a small right
pleural effusion. Lung window images demonstrate some ground-glass opacity
in the right upper lobe in multiple foci and to a lesser extent in the left
upper lobe. Mediastinal window settings show some consolidation in the left
lower lobe and lingula consistent with atelectasis. Prominent
atherosclerotic calcification is present. No compression deformity is seen
in the spine. There are multiple cystic-appearing lesions in the liver with
the largest being in the range of 2.5 cm diameter. The other included upper
abdominal viscera appear to be within normal limits. There is a fracture
with comminution in the proximal left humerus.
IMPRESSION: 1. Large left pleural effusion. Presumed consolidation secondary to
atelectasis in the lingula and left lower lobe.
2. Small right pleural effusion.
3. Presumed multiple hepatic cysts.
4. Ground-glass attenuation in the right lung predominantly upper lobe and
to a lesser extent lower lobe may represent patchy infiltrates, minimal
atelectasis or interstitial pneumonitis.

[REDACTED]

## 2013-01-19 IMAGING — CT CT ASPIRATION
1 series · 14 of 16 positions shown, 18 images · non-contrast
Comparison: none

REASON FOR EXAM: Liver lesion possible small cell CA liver
COMMENTS:

PROCEDURE:     CT  - CT GUIDED BIOPSY or ASPIRATION  - January 08, 2012 [DATE]
RESULT:     CT was obtained of the abdomen prior to liver biopsy. No solid
liver lesions are present. Multiple liver lesions are cysts. These findings
were discussed with the patient's  nurse and the patient's family.

[Series 2: soft tissue · axial · 0.65mm/px · z∈[-1085,-905]mm · 14 of 70 slices shown, 18 images]
[im 5/70  soft-tissue]
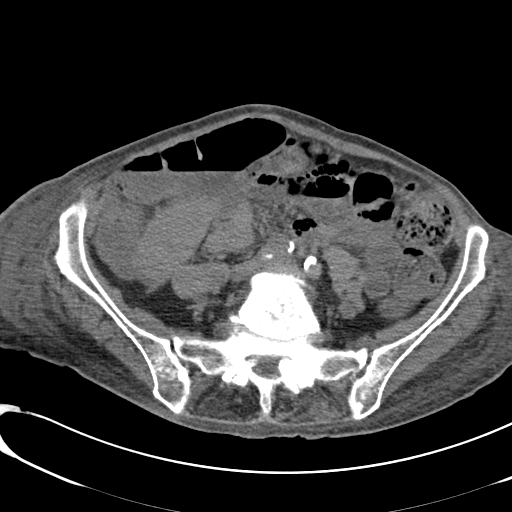
[im 5/70  bone]
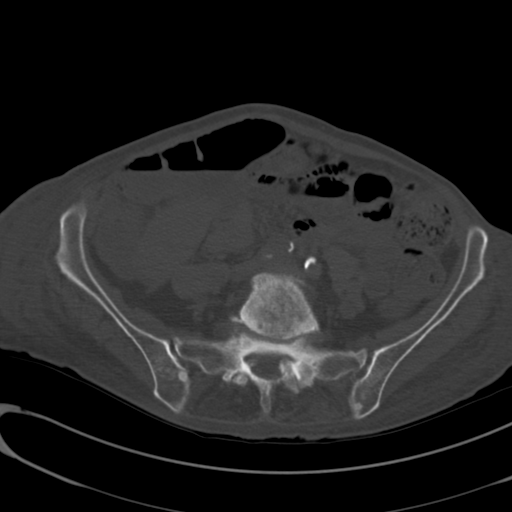
[im 10/70  soft-tissue]
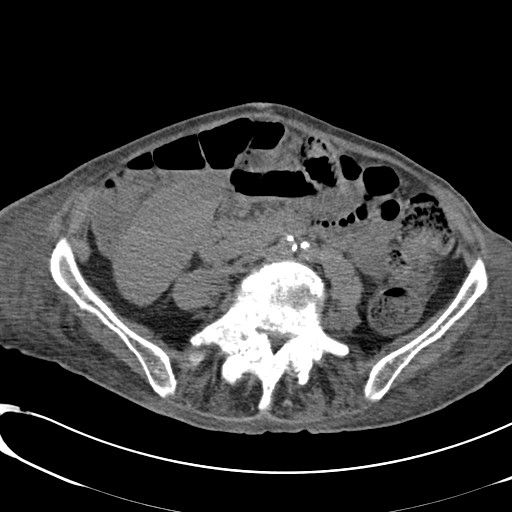
[im 14/70  soft-tissue]
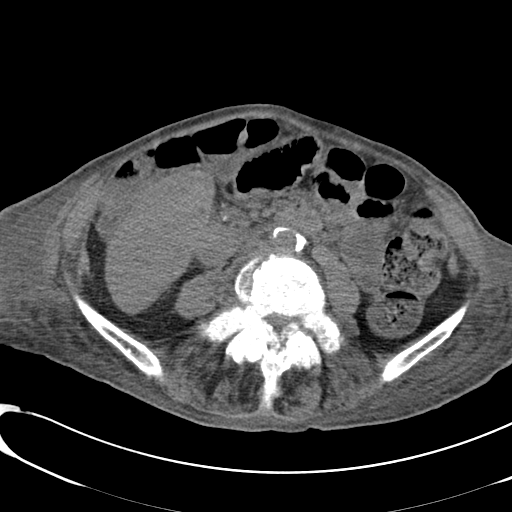
[im 19/70  soft-tissue]
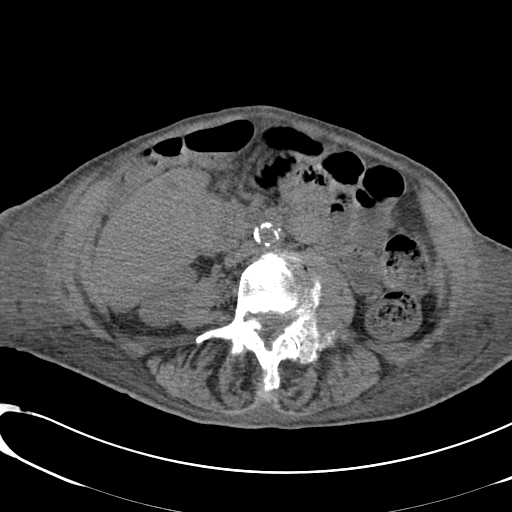
[im 24/70  soft-tissue]
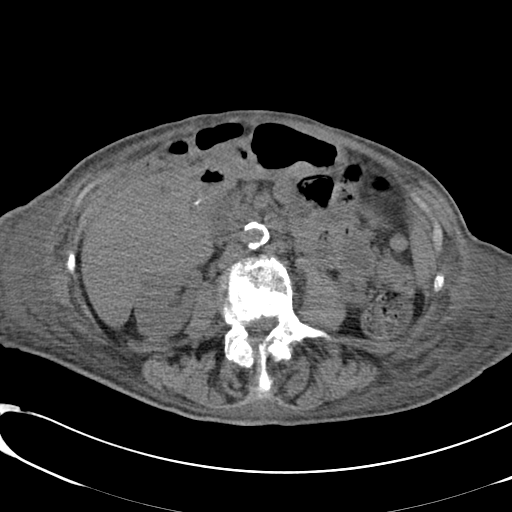
[im 24/70  bone]
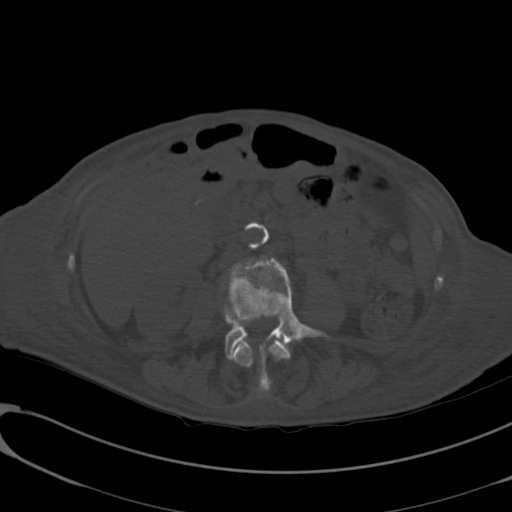
[im 28/70  soft-tissue]
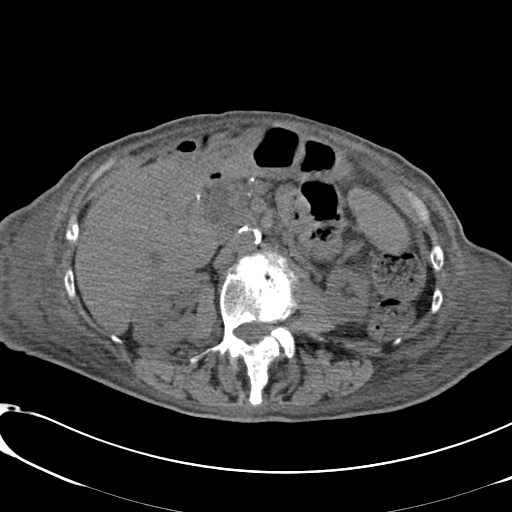
[im 33/70  soft-tissue]
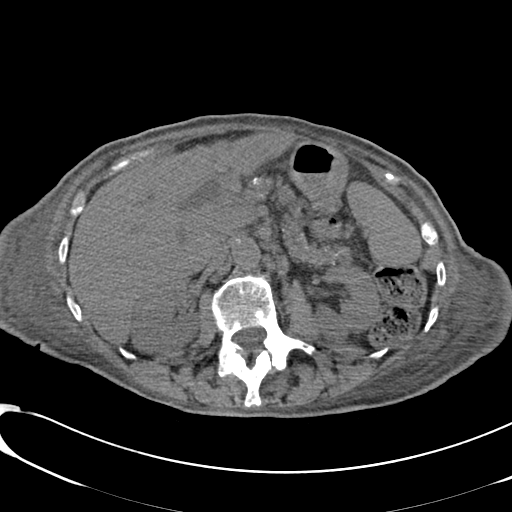
[im 37/70  soft-tissue]
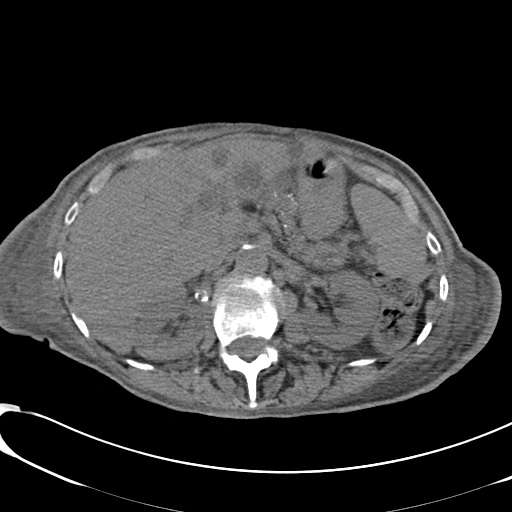
[im 42/70  soft-tissue]
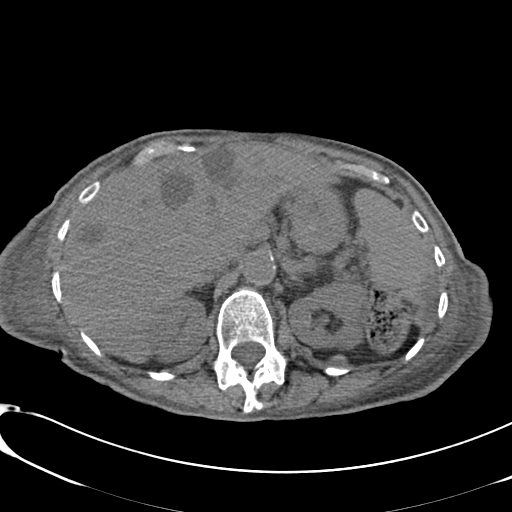
[im 42/70  bone]
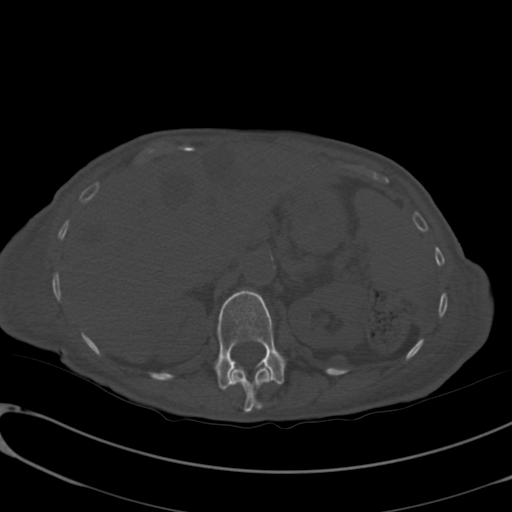
[im 47/70  soft-tissue]
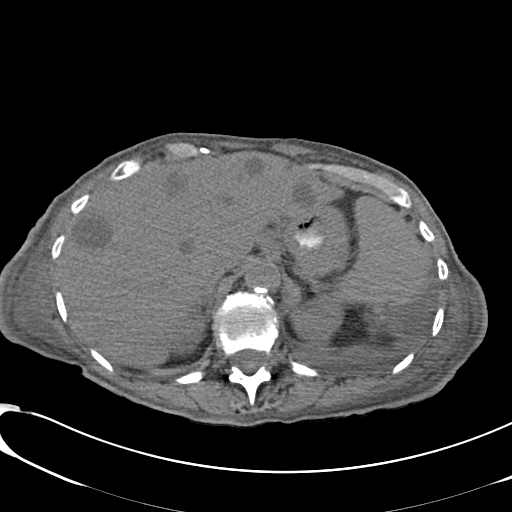
[im 51/70  soft-tissue]
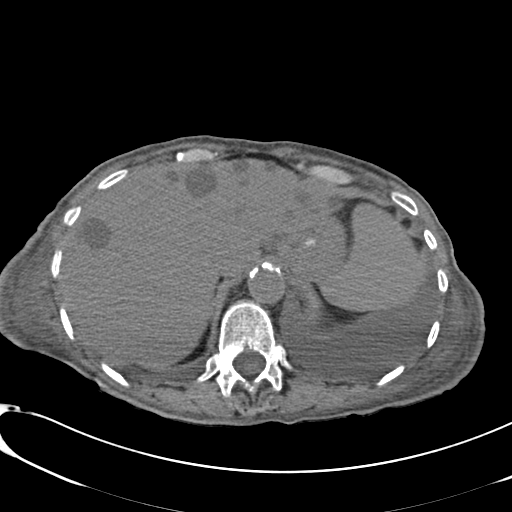
[im 56/70  soft-tissue]
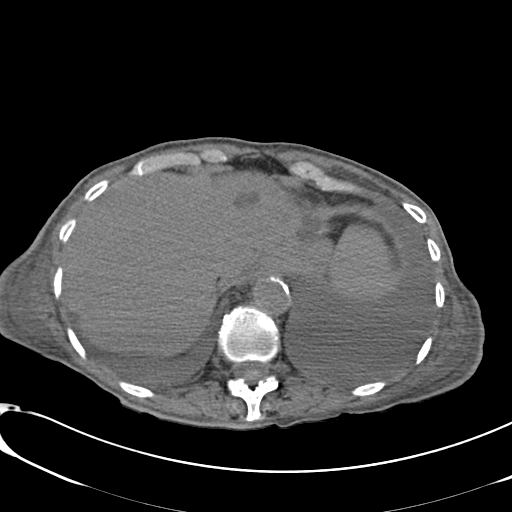
[im 60/70  soft-tissue]
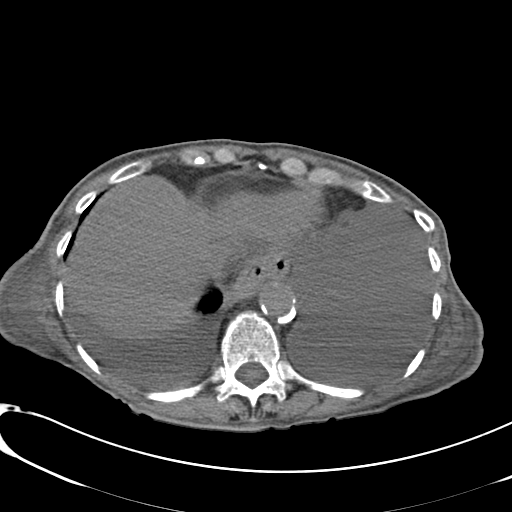
[im 60/70  bone]
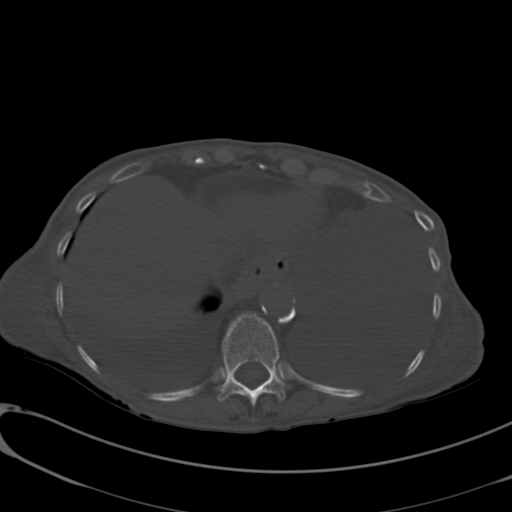
[im 65/70  soft-tissue]
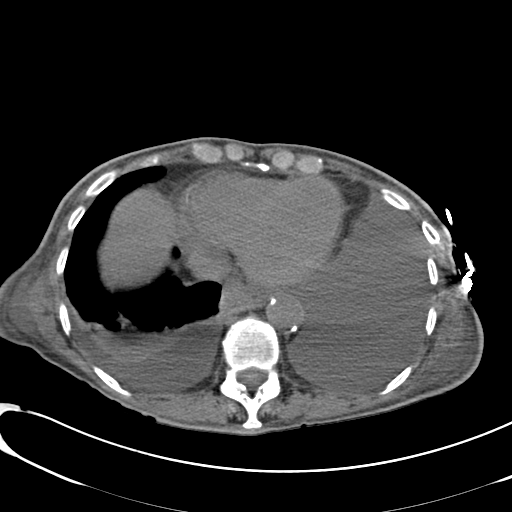

[14 of 16 positions shown; findings below may reference images not displayed]

IMPRESSION: Simple hepatic cysts. No solid hepatic lesion noted for
biopsy.

## 2013-03-16 DEATH — deceased

## 2014-05-05 NOTE — Consult Note (Signed)
I examined Ariel Mccoy this morning and discussed her condition with her and her daughter. She does not need surgery. I would continue to treat the weeping edema from her arm as you are doing. I have written an order for nursing to paint the wound daily with betadine in addiition to present treatment.  Electronic Signatures: Clare GandySmith, Guadalupe Kerekes E (MD)  (Signed on 24-Sep-13 11:56)  Authored  Last Updated: 24-Sep-13 11:56 by Clare GandySmith, Mattison Stuckey E (MD)

## 2014-05-05 NOTE — Consult Note (Signed)
Brief Consult Note: Diagnosis: Right hip multiple myeloma.   Patient was seen by consultant.   Consult note dictated.   Comments: Patient has extensive involvement of proximal right femur.  Patient having severe pain in right hip.  On fentanyl PCA.  Symptoms may be from bone pain as a result of the cancer itself.  It may also be from an occult fracture suggested by radiology on her MRI.  Patient might improve from IM fixation of fracture, but there is no guarantee.  She may also benefit from intravenous bisphonate therapy if oncology and medicine feel she would be a candidate.  Will continue to follow with Dr. Tamala Julian.  Electronic Signatures: Thornton Park (MD)  (Signed 21-Dec-13 14:18)  Authored: Brief Consult Note   Last Updated: 21-Dec-13 14:18 by Thornton Park (MD)

## 2014-05-05 NOTE — Consult Note (Signed)
81 year old female admitted with renal failure, with ortho consultation regarding left humerus fracture and hematoma. reviewed and xrays examined. obviously is not a surgical candidate, and I would continue with present immobilzation of the shoulder for comfort. will be by later today to examine the patient and her arm, and will proceed from there.  Electronic Signatures: Clare GandySmith, Syble Picco E (MD)  (Signed on 24-Sep-13 09:41)  Authored  Last Updated: 24-Sep-13 09:41 by Clare GandySmith, Ferrell Claiborne E (MD)

## 2014-05-05 NOTE — Consult Note (Signed)
Brief Consult Note: Diagnosis: intractable pain.   Patient was seen by consultant.   Consult note dictated.   Comments: 81yo  female with possible multiple myeloma with severe pain right hip/femur. +metstatic disease to bone.Worsening pain  asked to see the pt for pain control.  Electronic Signatures: James Ivanoff, Roselie Awkward (MD)  (Signed 20-Dec-13 17:52)  Authored: Brief Consult Note   Last Updated: 20-Dec-13 17:52 by James Ivanoff, Roselie Awkward (MD)

## 2014-05-05 NOTE — H&P (Signed)
PATIENT NAME:  Ariel Mccoy, Ariel Mccoy MR#:  [id number] DATE OF BIRTH:  [birth year]  DATE OF ADMISSION:  10/09/2011  PRIMARY CARE PHYSICIAN: Dr. Raechel Ache   The patient is sent in for direct admission by Dr. Kallie Edward, Oncology, for acute renal failure.   HISTORY OF PRESENT ILLNESS: This is an 81 year old female who has been diagnosed with multiple myeloma, on no treatment for that. On Tuesday the 17th she had a fall and fractured her humerus and she is in a sling. Her left arm has been swelling. She has been in the Emergency Room three times and now has some weeping edema through a hole on the left arm. Her legs are very weak and her creatinine was worsening. In the ER on the 21st her creatinine was up at 1.87. She has been having falls and her balance is off. She was sent in for direct admission for acute renal failure and further evaluation on the left arm.   PAST MEDICAL HISTORY:  1. Multiple myeloma. 2. Hypertension. 3. Depression. 4. Constipation. 5. Hyperlipidemia. 6. Chronic pain.   PAST SURGICAL HISTORY:  1. Cholecystectomy. 2. Hysterectomy. 3. Appendectomy. 4. Cataract on the left.   ALLERGIES: No known drug allergies.   MEDICATIONS:  1. Lasix p.r.n.  2. Senna S 1 tablet twice a day.  3. Vitamin D2 1.25 mg once a week. 4. Simvastatin 20 mg at bedtime.  5. Quinapril 40 mg daily.  6. Paxil 20 mg daily.  7. OxyContin 15 mg 3 times a day.  8. Dyazide 25/37.5 one capsule daily.  9. Estradiol 0.5 mg daily.  10. Aspirin 81 mg daily.  11. Percocet 10/325 2 tablets 3 times a day as needed for pain.   SOCIAL HISTORY: No smoking. No alcohol. No drug use. Lives with her daughter. Used to be a housewife.   FAMILY HISTORY: Father died at 33 of an MI. Mother died at 74 of metastatic stomach cancer.   REVIEW OF SYSTEMS: CONSTITUTIONAL: Positive for chills. No fever. No sweats. Positive for weight loss from 127 down to 112. Positive for weakness. EYES: She does wear glasses. EARS, NOSE, MOUTH, AND  THROAT: Decreased hearing with hearing aids. Positive for runny nose. No sore throat. No difficulty swallowing. CARDIOVASCULAR: No chest pain. Palpitations with activity. RESPIRATORY: Positive for shortness of breath with activity. No coughing. No sputum. No hemoptysis. GASTROINTESTINAL: Positive for constipation. No nausea. No vomiting. No abdominal pain. No bright red blood per rectum. No melena. GENITOURINARY: No burning on urination. No hematuria. MUSCULOSKELETAL: Positive for left shoulder pain and bone pain. INTEGUMENTARY: Positive for bruising on the left arm. NEUROLOGIC: No fainting or blackouts but feels like it. PSYCHIATRIC: On medication for depression. ENDOCRINE: No thyroid problems. HEMATOLOGIC/LYMPHATIC: History of anemia.   PHYSICAL EXAMINATION:   VITAL SIGNS: Temperature 97.8, pulse 89, respirations 18, blood pressure 139/76, pulse oximetry 98%.   GENERAL: No respiratory distress.   EYES: Conjunctivae normal. Lids normal. Pupils equal, round, and reactive to light. Extraocular muscles intact. No nystagmus.   EARS, NOSE, MOUTH, AND THROAT: Nasal mucosa no erythema. Throat no erythema. No exudate seen. Lips and gums no lesions.   NECK: No JVD. No bruits. No lymphadenopathy. No thyromegaly. No thyroid nodules palpated.   RESPIRATORY: Lungs clear to auscultation. No use of accessory muscles to breathe. No rhonchi, rales, or wheeze heard.   CARDIOVASCULAR: S1, S2 normal. +0/1 systolic ejection murmur. Carotid upstroke 2+ bilaterally. No bruits. Dorsalis pedis pulses 2+ bilaterally. Trace edema of the lower extremity.   ABDOMEN:  Soft, nontender. No organomegaly/splenomegaly. Normoactive bowel sounds. No masses felt.   LYMPHATIC: No lymph nodes in the neck.   MUSCULOSKELETAL: No clubbing, edema, or cyanosis.   SKIN: Very large bruise on the entire left arm, yellow on the left part of the chest, purplish down the left arm, and a blackish-bluish on the fingers on the left hand.    NEUROLOGIC: Cranial nerves II through XII grossly intact.   PSYCHIATRIC: The patient is oriented to person, place, and time.   LABORATORY AND RADIOLOGICAL DATA: Ordered.   ASSESSMENT AND PLAN:  1. Acute renal failure and relative hypotension. Will hold all blood pressure medications including Dyazide, quinapril, and Norvasc. Will give gentle IV fluids and get daily creatinines. Will get stat creatinine at this point.  2. Hematoma of the left arm after a fracture of the humerus. Will get a sonogram to rule out DVT. Will get an Ortho consult. Hold aspirin at this point. I do not feel that there is an infection on the arm. I will not give antibiotics at this time. This is a hematoma that will take weeks and weeks to improve. I need to give fluid secondary to the acute renal failure and hypotension.  3. Weakness. Will continue physical therapy evaluation.  4. Multiple myeloma and chronic pain. Continue pain medication.  5. Hyperlipidemia. Will hold Zocor with the patient's weakness.  6. Depression. Continue Paxil.  7. Constipation. Continue Senna.  TIME SPENT ON ADMISSION: 55 minutes.   CODE STATUS: The patient is a DO NOT RESUSCITATE.    ____________________________ Tana Conch. Leslye Peer, MD rjw:drc D: 10/09/2011 16:50:11 ET T: 10/09/2011 17:13:43 ET JOB#: [id number]  cc: Tana Conch. Leslye Peer, MD, <Dictator> Christena Flake. Raechel Ache, MD Marisue Brooklyn MD ELECTRONICALLY SIGNED 10/17/2011 21:25

## 2014-05-05 NOTE — Discharge Summary (Signed)
PATIENT NAME:  Ariel Mccoy, Ariel Mccoy MR#:  [id number] DATE OF BIRTH:  [birth year]  DATE OF ADMISSION:  10/12/2011 DATE OF DISCHARGE:  10/16/2011   ADMITTING PHYSICIAN: Loletha Grayer, MD   DISCHARGING PHYSICIAN: Gladstone Lighter, MD   PRIMARY MD: Ezequiel Kayser, MD   [place]:  1. Orthopedic consultation with Dr. Christophe Louis and Dr. Mack Guise   2. Oncology consultation by Dr. Kennieth Francois   DISCHARGE DIAGNOSES:  1. Left humerus fracture secondary to fall.  2. Hypertension.  3. Multiple myeloma.  4. Hyperlipidemia.  5. Depression.  6. Chronic kidney disease due to possible myeloma. 7. Acute renal failure- treated   HOME MEDICATIONS:  1. Acetaminophen/oxycodone 325 mg/10 mg 2 tablets every six hours as needed for pain.  2. Oxycodone 10 mg p.o. three times a day.  3. Simvastatin 20 mg p.o. daily.  4. Paxil 20 mg p.o. daily.  5. Quinapril 40 mg daily.  6. Estradiol 0.5 mg p.o. daily.  7. Aspirin 81 mg p.o. daily.  8. Senna Colace one tablet p.o. b.i.d.  9. Norvasc 10 mg p.o. daily.  10. Calcium with Vitamin D 600 mg/400 international units 1 tablet p.o. b.i.d.   DISCHARGE DIET: Low sodium diet.   DISCHARGE ACTIVITY: As tolerated.   FOLLOW-UP INSTRUCTIONS:  1. Oncology follow-up in two weeks  2. Orthopedic follow-up with Dr. Mack Guise in two weeks   3. Physical therapy   4. PCP follow-up in four weeks   LABS AND IMAGING STUDIES: WBC 10.1, hemoglobin 9.6, hematocrit 29.5, platelet count 508, sodium 140, potassium 4.4, chloride 106, bicarb 27, BUN 23, creatinine 0.97, glucose 97, calcium 7.7. Urinalysis negative for any infection.   Ultrasound Doppler left upper extremity showing no evidence of any DVT in proximal or distal of left upper extremity.   TSH 1.17. Magnesium 2.1.   Left shoulder x-ray showing proximal left humeral fracture.   BRIEF HOSPITAL COURSE: Ms. Reinertsen is an 81 year old Caucasian female with past medical history significant for  hypertension, hyperlipidemia, and multiple myeloma recently diagnosed and follows up at the [facility] with Dr. Kallie Edward who was admitted to the hospital on 10/12/2011 secondary to a fall that she had on Tuesday, September 17th and fractured her humerus. She was discharged from the ER with a sling to her left arm, however, the arm has swollen significantly with weeping edema and she came in with worsening pain and also swelling to that arm.  1. Left humerus fracture secondary to fall. She had a comminuted fracture. Since she was not a surgical candidate due to her multiple medical problems, she was put in a sling. Orthopedics has seen the patient who recommended conservative management. Her arm is extremely swollen but she is still able to feel touch and able to move her fingers. Her skin is very fragile and will take some time for healing and decrease of the erythema. Ice packs and elevation were recommended. She had extensive seeping for which wound care was consulted, however, just putting a soft pad caused decrease in her edema. Physical therapy has recommended rehab. Her pain was extremely uncontrolled initially and she was placed on morphine PCA and now is being changed to oral medications at the time of discharge.  2. Multiple myeloma. The patient was diagnosed with myeloma in June 2012 and recent scans have shown abnormal tracer accumulation in right femur, femoral head, and also in L3 to L5 spine level. She has baseline bone pain at right hip on walking and is on chronic  pain medications. Questionable metastatic disease due to myeloma and on conservative management and follows at the [facility]. Her hip pain is at baseline and is better with the medication. She will continue to follow with Oncology after discharge as they recommend. The patient refused a bone marrow biopsy and aspirate in the past so the diagnosis of myeloma is a possible diagnosis at this time.  3. Hypertension and hyperlipidemia.  Her other home medications are being continued at the time of discharge.  4. CKD secondary to her multiple myeloma.   CODE STATUS: DO NOT RESUSCITATE as verified with the patient and her daughter at the time of discharge by Dr. Tressia Miners.   DISCHARGE CONDITION: Guarded.   DISCHARGE DISPOSITION: To short-term rehab.   TIME SPENT ON DISCHARGE: 45 minutes.   ____________________________ Gladstone Lighter, MD rk:drc D: 10/16/2011 11:07:00 ET T: 10/16/2011 11:24:25 ET JOB#: [id number]  cc: Gladstone Lighter, MD, <Dictator> Rae Halsted. Kallie Edward, MD Christena Flake. Raechel Ache, MD  Gladstone Lighter MD ELECTRONICALLY SIGNED 10/19/2011 14:57

## 2014-05-05 NOTE — Consult Note (Signed)
MRI is reviewed and is as per the radiology report. No definitive fracture lines present. Overall given her poor health, I believe that if more radiation treatments can safely be given for pain relief, that that would be the best option. The surgical option would be a prophylactice femoral nailing which is a rather large procedure and might not be well tolerated. I will speak with the patient and her daughter later today.  Electronic Signatures: Clare GandySmith, Piers Baade E (MD)  (Signed on 18-Dec-13 09:11)  Authored  Last Updated: 18-Dec-13 09:11 by Clare GandySmith, Anselma Herbel E (MD)

## 2014-05-05 NOTE — Consult Note (Signed)
    Comments   Follow up visit made to assess response to pain meds. Unfortunately, pt lost IV access and has not gotten toradol nor is she getting fentanyl PCA. Did get new dose fentanyl patch placed. RN to give meds when pt has new IV placed. Discussed with daughter.   Electronic Signatures: Perrin Eddleman, Harriett SineNancy (MD)  (Signed 23-Dec-13 14:56)  Authored: Palliative Care   Last Updated: 23-Dec-13 14:56 by Joseantonio Dittmar, Harriett SineNancy (MD)

## 2014-05-05 NOTE — Consult Note (Signed)
Patient continues to have moderate amount of persistent drainage. I have put in a consult to wound management for any further suggestions. A wound vac may be indicated at this point.  Electronic Signatures: Clare GandySmith, Doroteo Nickolson E (MD)  (Signed on 26-Sep-13 08:27)  Authored  Last Updated: 26-Sep-13 08:27 by Clare GandySmith, Sakai Wolford E (MD)

## 2014-05-05 NOTE — Consult Note (Signed)
81 year old female known to me from recent left shoulder fracture, which has gone on to heal quite well. hospital now with known diagnosis of multiple myeloma and severe pain in the right hip area. had according to her daughter 67 radiation treatments for her hip pain and got good relief. Most recently however she has had increasing pain such that barely able to walk. present is getting decent relief from IV pain med. Is able to walk into bathroom with cane and assistance. are reviewed of the pelvis. There is infiltrative disease in the proximal femur consisted with multiple myeloma and degenerative arthritis in the hip joint. Awaiting MRI to be done today. will followup after the MRI. I believe that if at all possible conservative treatment is indicated because of her poor health, ? more radiation possilble pending results of MRI.  Electronic Signatures: Lucas Mallow (MD)  (Signed on 17-Dec-13 11:13)  Authored  Last Updated: 17-Dec-13 11:13 by Lucas Mallow (MD)

## 2014-05-05 NOTE — Consult Note (Signed)
Referring Physician:  Georges Mouse   Primary Care Physician:  [facility], [address] [place],  [address], [place] [phone]  Reason for Consult:  Admit Date: 01-Jan-2012   Chief Complaint: new onset ptosis   Reason for Consult: new onset ptosis   History of Present Illness:  History of Present Illness:   81 year old female with multiple myeloma and severe right hip pain s/p multiple radiation treatments presents with severe pain.  Neurology is asked to evaluate given that patient is seen to have right eye ptosis intermittently.  The patient says she hasn't really noticed it.  She denies double vision or blurry vision.  She says she generally feels weak but says this is due to her pain.  She denies shortness of breath or difficulty chewing or swallowing her food.  She does not notice improvements in her symptoms in the mornings or after a nap.  Nothing makes the symptoms better or worse that she can think of.      GENERAL: woman who is in pain.  Normocephalic and atraumatic. exam without pharmacologic dilation shows normal disc size, appearance and C/D ratio.  There is no papilledema.  Mild ptosis is noted int he right eye with 30 seconds of vertigal gaze.  There is no double vision with any provocative maneuvers.  Ice pack test was not performed at bedside today.   and S2 sounds are within normal limits, without murmurs, gallops, or rubs. - Normal- NormalDrift - Absent bilaterally.- Gait and station are within normal limits.- Absent is no fatiguable weakness noted.     Shoulder abduction (deltoid/supraspinatus, axillary/suprascapular n, C5)   Elbow flexion (biceps brachii, musculoskeletal n, C5-6)   Elbow extension (triceps, radial n, C7)   Finger adduction (interossei, ulnar n, T1) strength testing is limited due to severe pain 3/4    Hip flexion (iliopsoas, L1/L2) 3/4    Knee flexion (hamstrings, sciatic n, L5/S1)  3/4    Knee extension (quadriceps,  femoral n, L3/4) 3/4    Ankle dorsiflexion (tibialis anterior, deep fibular n, L4/5)   Ankle plantarflexion (gastroc, tibial n, S1)  STATUS:is oriented to person, place and time.  Recent and remote memory are intact.  Attention span and concentration are intact.  Naming, repetition, comprehension and expressive speech are within normal limits.  Patient's fund of knowledge is within normal limits for educational level. NERVES:   CN II (normal visual acuity and visual fields)   CN III, IV, VI (extraocular muscles are intact)   CN V (facial sensation is intact bilaterally)   CN VII (facial strength is intact bilaterally)   CN VIII (hearing is intact bilaterally)   CN IX/X (palate elevates midline, normal phonation)   CN XI (shoulder shrug strength is normal and symmetric)   CN XII (tongue protrudes midline) to pain and temp bilaterally (spinothalamic tracts)to position and vibration bilaterally (dorsal columns)    Biceps   Brachioradialis    Patellar   Achilles to nose testing is within normal limits.   81 year old female with multiple myeloma and severe right hip pain s/p multiple radiation treatments presents with severe pain.  Neurology is asked to evaluate given that patient is seen to have right eye ptosis intermittently. the right eye ptosis upon vertical gaze for 30 seconds, it would be prudent to evaluate this patient for myasthenia gravis with an acetylcholine receptor antibody MG panel.  She does not display many of the other common signs  or symptoms of MG as outlined above.  Depending on the result of the MG panel, then I would consider rep nerve stim or sfEMG although I would not pursue this testing at this point given that the only clear symptom of MG identified is unilateral ptosis.  If testing is negative, then I would consider that the etiology of the unilateral ptosis may be due to a structural cause sucha s levator dehiscence, although there are many other potential structral causes.   However, the fatiguability noted on vertical gaze raises enough of a question to evaluate the blood for the possibility of MG as this would be important to know if she were to become short of breath or weaker.   Melrose Nakayama, MD        ROS:   General denies complaints     HEENT no complaints     Lungs no complaints     Cardiac no complaints     GI pain     GU no complaints     Musculoskeletal back pain  muscle ache  joint pain     Extremities pain     Skin no complaints     Endocrine no complaints    Past Medical/Surgical Hx:  Myeloma:   Fractured left shoulder:   enthasopathy of hip:   Hypercholesterolemia:   lumbago:   Chronic Right Leg Pain:   Anxiety:   Depression:   htn:   left foot surgery:   Hysterectomy:   Appendectomy:   Lap Chole on April 1st, 2009:   Home Medications: Medication Instructions Last Modified Date/Time  promethazine 25 mg oral tablet 1 tab(s) orally every 4 hours, As Needed- for Nausea, Vomiting  16-Dec-13 14:00  Duragesic-12 12 mcg/hr transdermal film, extended release 1 PATCH transdermal every 72 hours 16-Dec-13 14:00  Senna S 50 mg-8.6 mg oral tablet 1 tab(s) orally 2 times a day 16-Dec-13 14:00  simvastatin 20 mg oral tablet 1   once a day (at bedtime)  16-Dec-13 14:00  quinapril 40 mg oral tablet 1 tab(s) orally once a day 16-Dec-13 14:00  estradiol 0.5 mg oral tablet 1 tab(s) orally once a day 16-Dec-13 14:00  aspirin 81 mg oral tablet 1 tab(s) orally once a day 16-Dec-13 14:00  MiraLax 1 dose(s) orally once a day, As Needed-  for Constipation  16-Dec-13 14:00  Lasix 40 mg oral tablet 1 tab(s) orally once a day as needed. (Usually every other day) 16-Dec-13 14:00  Vitamin D3 1000 intl units oral capsule 1 cap(s) orally once a week 16-Dec-13 14:00  paroxetine 30 mg oral tablet 1 tab(s) orally once a day 16-Dec-13 14:00  hydrochlorothiazide-triamterene 25 mg-37.5 mg oral tablet 0.5 tab(s) orally once a day 16-Dec-13 14:00   [facility]  Current Meds:  Sodium Chloride 0.9%, 1000 ml at 15 ml/hr  Acetaminophen * tablet, ( Tylenol (325 mg) tablet)  650 mg Oral q4h PRN for pain or temp. greater than 100.4  - Indication: Pain/Fever  Promethazine injection, ( Phenergan injection )  12.5 to 25 mg, IV push, q4h PRN for nausea, vomiting, motion sickness  Indication: Antiemetic/ Motion Sickness/ Sedative/ Antihistamine  docusate-senna 50/8.6 mg tablet,  ( Senna-Docusate Sodium 8.6/50mg tablet)  2 tablet(s) Oral at bedtime  - Indication: Stool Softner/ Constipation/ Bowel Prep for Surgery  Aspirin Chewable, 81 mg Oral daily  - Indication: Pain/Fever/Thromboembolic Disorders/Post MI/Prophylaxis MI  hydrochlorothiazide-triamterene 25/37.5 mg tablet, ( Maxzide-25)  0.5 tablet(s) Oral daily  PARoxetine tablet, ( Paxil)  30 mg Oral daily  -  Indication: Depression/ Panic/ OCD  Quinapril tablet, 40 mg Oral daily  - Indication: Hypertension/ Systolic CHF  Estradiol tablet, ( Estrace)  0.5 mg Oral daily  - Indication: Hormone Replacement/Menopause/Hypogonadism/Castration/Ovarian Failure  Promethazine tablet, ( Phenergan)  25 mg Oral q4-6h PRN for nausea, vomiting, motion sickness  - Indication: Antiemetic/ Motion Sickness/ Sedative/ Antihistamine  MorphINE PCA, (1 mg/ml), Continuous Dose: 2 mg/hr, PCA Dose: 2 mg, Lock Out Interval: 8 minutes, Four Hour Limit: 30 mg, Loading Dose: 0 mg, [Med Admin Window: 30 min before or after scheduled dose]  fentaNYL Patch,  ( Duragesic patch )  12 mcg Topical q3days  -Indication:Pain  Instructions:  [Med Admin Window: 30 mins before scheduled dose]  fentaNYL Patch, ( Duragesic patch )  25 mcg Topical q3days  -Indication:Pain  Instructions:  [Med Admin Window: 30 mins before or after scheduled dose]  Enoxaparin injection,  ( Lovenox injection )  40 mg, Subcutaneous, daily  Indication: Prophylaxis or treatment of thromboembolic disorders, Monitor Anticoags per hospital  protocol  Allergies:  Codeine: N/V  Sulfa drugs: Unknown  Vital Signs: **Vital Signs.:   18-Dec-13 13:53   Vital Signs Type Routine   Temperature Temperature (F) 98.6   Celsius 37   Temperature Source axillary   Pulse Pulse 91   Respirations Respirations 18   Systolic BP Systolic BP 413   Diastolic BP (mmHg) Diastolic BP (mmHg) 82   Mean BP 111   Pulse Ox % Pulse Ox % 92   Pulse Ox Activity Level  At rest   Oxygen Delivery 2L   Lab Results:  Routine Chem:  17-Dec-13 03:58    Glucose, Serum 88   BUN  24   Creatinine (comp) 0.79   Sodium, Serum 141   Potassium, Serum 3.7   Chloride, Serum  108   CO2, Serum 28   Calcium (Total), Serum  8.0   Anion Gap  5   Osmolality (calc) 285   eGFR (African American) >60   eGFR (Non-African American) >60 (eGFR values <73m/min/1.73 m2 may be an indication of chronic kidney disease (CKD). Calculated eGFR is useful in patients with stable renal function. The eGFR calculation will not be reliable in acutely ill patients when serum creatinine is changing rapidly. It is not useful in  patients on dialysis. The eGFR calculation may not be applicable to patients at the low and high extremes of body sizes, pregnant women, and vegetarians.)    14:00    Result Comment BONE MARROW ASP ONLY - CANCEL - NO LONGER A VALID ORDER.  Result(s) reported on 02 Jan 2012 at 02:20PM.  Routine Hem:  17-Dec-13 03:58    WBC (CBC) 7.0   RBC (CBC)  3.59   Hemoglobin (CBC)  10.1   Hematocrit (CBC)  31.1   Platelet Count (CBC) 287   MCV 86   MCH 28.0   MCHC 32.4   RDW  16.3   Neutrophil % 70.8   Lymphocyte % 14.7   Monocyte % 8.3   Eosinophil % 5.3   Basophil % 0.9   Neutrophil # 4.9   Lymphocyte # 1.0   Monocyte # 0.6   Eosinophil # 0.4   Basophil # 0.1 (Result(s) reported on 02 Jan 2012 at 04:38AM.)    14:00    Physician Collecting -   Tech Assisting -   Bone Marrow Prep -   Electronic Signatures: PAnabel Bene(MD)  (Signed 19-Dec-13  00:17)  Authored: REFERRING PHYSICIAN, Primary Care Physician, Consult, History of  Present Illness, Review of Systems, PAST MEDICAL/SURGICAL HISTORY, HOME MEDICATIONS, Current Medications, ALLERGIES, NURSING VITAL SIGNS, LAB RESULTS   Last Updated: 19-Dec-13 00:17 by Anabel Bene (MD)

## 2014-05-05 NOTE — Consult Note (Signed)
Reason for Visit: This 81 year old Female patient presents to the clinic for initial evaluation of  Metastatic disease .   Referred by Dr. Kallie Edward.  Diagnosis:   Chief Complaint/Diagnosis   56 your female with presumed myeloma with narcotic dependent pain in right hip and bone scan and plain films evidence of metastatic disease.   Pathology Report According to notes patient has refused bone marrow biopsy    Imaging Report Bone scan MRI scan and plain films are reviewed    Referral Report Clinical notes reviewed    Planned Treatment Regimen Palliative radiation therapy to right hip    HPI   patient is a 81 year old female who is been insignificant narcotic dependent pain in her right hip since at least September of 2013. Initial bone scan showed marked uptake in her lower lumbar spine as well as right hip and left shoulder. She underwent traumatic fall breaking her left humerus. Orthopedic surgery decline not to [facility] and is being healing by conservative treatment. She did have immunoelectrophoresis showing slight M spike. She also has a high alkaline phosphatase. She is really having no significant lower back pain. Her shoulder is healing well at this time according to the patient. I been asked to evaluate the patient for palliative radiation therapy to her right hip.  Past Hx:    Myeloma:    Fractured left shoulder: Sep 2013   enthasopathy of hip:    Hypercholesterolemia:    lumbago:    Chronic Right Leg Pain:    Anxiety:    Depression:    htn:    left foot surgery: 2009   Hysterectomy:    Appendectomy:    Lap Chole on April 1st, 2009:   Past, Family and Social History:   Past Medical History positive    Cardiovascular hyperlipidemia; hypertension    Neurological/Psychiatric anxiety; depression    Past Surgical History appendectomy; cholecystectomy    Past Medical History Comments Hysterectomy, left foot surgery, chronic right hip pain, chronic back pain     Family History positive    Family History Comments Mother expired from cancer of unknown type    Social History noncontributory    Additional Past Medical and Surgical History Accompanied by daughter today   Allergies:   Codeine: N/V  Sulfa drugs: Unknown  Home Meds:  Home Medications: Medication Instructions Status  Duragesic-12 12 mcg/hr transdermal film, extended release 1 PATCH transdermal every 72 hours Active  Duragesic-25 25 mcg/hr transdermal film, extended release 1 PATCH transdermal every 72 hours Active  Senna S 50 mg-8.6 mg oral tablet 1 tab(s) orally 2 times a day Active  simvastatin 20 mg oral tablet 1   once a day (at bedtime)  Active  paroxetine 20 mg oral tablet 1 tab(s) orally once a day Active  quinapril 40 mg oral tablet 1 tab(s) orally once a day Active  estradiol 0.5 mg oral tablet 1 tab(s) orally once a day Active  aspirin 81 mg oral tablet 1 tab(s) orally once a day Active  MiraLax 1 dose(s) orally once a day, As Needed-  for Constipation  Active  acetaminophen-oxycodone 325 mg-10 mg oral tablet 2 tab(s) orally every 4 hours Active  hydrochlorothiazide-triamterene 25 mg-37.5 mg oral tablet 1 tab(s) orally once a day Active  Lasix 40 mg oral tablet 1 tab(s) orally once a day as needed. (Usually every other day) Active  Vitamin D3 1000 intl units oral capsule 1 cap(s) orally once a week Active   Review of Systems:  General negative    Performance Status (ECOG) 1    Skin negative    Breast negative    Ophthalmologic negative    ENMT negative    Respiratory and Thorax negative    Cardiovascular negative    Gastrointestinal negative    Genitourinary negative    Musculoskeletal negative    Neurological see HPI    Psychiatric see HPI    Hematology/Lymphatics negative    Endocrine negative    Allergic/Immunologic negative    Review of Systems   according to the nurse's notes Patient denies any weight loss, fatigue, weakness, fever,  chills or night sweats. Patient denies any loss of vision, blurred vision. Patient denies any ringing  of the ears or hearing loss. No irregular heartbeat. Patient denies heart murmur or history of fainting. Patient denies any chest pain or pain radiating to her upper extremities. Patient denies any shortness of breath, difficulty breathing at night, cough or hemoptysis. Patient denies any swelling in the lower legs. Patient denies any nausea vomiting, vomiting of blood, or coffee ground material in the vomitus. Patient denies any stomach pain. Patient states has had normal bowel movements no significant constipation or diarrhea. Patient denies any dysuria, hematuria or significant nocturia. Patient denies any problems walking, swelling in the joints or loss of balance. Patient denies any skin changes, loss of hair or loss of weight. Patient denies any excessive worrying or anxiety or significant depression. Patient denies any problems with insomnia. Patient denies excessive thirst, polyuria, polydipsia. Patient denies any swollen glands, patient denies easy bruising or easy bleeding. Patient denies any recent infections, allergies or URI. Patient "s visual fields have not changed significantly in recent time.  Nursing Notes:  Nursing Vital Signs and Chemo Nursing Nursing Notes: *CC Vital Signs Flowsheet:   18-Nov-13 13:09   Temp Temperature 97.3   Pulse Pulse 81   Respirations Respirations 20   SBP SBP 162   DBP DBP 79   Pain Scale (0-10)  9   Current Weight (kg) (kg) 55.6   Physical Exam:  General/Skin/HEENT:   General normal    Skin normal    Eyes normal    ENMT normal    Head and Neck normal    Additional PE Well-developed wheelchair-bound female in NAD. Lungs are clear to A&P cardiac examination shows regular rate and rhythm. Abdomen is benign with no organomegaly or masses noted. Range of motion of her lower extremities does not elicit pain. She has good motor and sensory levels  bilaterally in the lower extremities. Range of motion of her left upper extremity does not elicit pain. Deep palpation of her lumbar spine does not elicit pain. No peripheral adenopathy or edema is identified except for some slight lymphedema in her left upper extremity.   Breasts/Resp/CV/GI/GU:   Respiratory and Thorax normal    Cardiovascular normal    Gastrointestinal normal    Genitourinary normal   MS/Neuro/Psych/Lymph:   Musculoskeletal normal    Neurological normal    Lymphatics normal   Assessment and Plan:  Impression:   metastatic involvement of right hip from as yet unbiopsied carcinoma presumed to be multiple myeloma.  Plan:   at this time I've discussed the case personally with Dr. Kallie Edward. Like to go ahead with palliative radiation therapy to her right hip. Would plan 3000 cGy in 10 fractions. Part of my scanning tomorrow for treatment field placement I will take a look at her chest abdomen and pelvis to rule out any other potential  site for primary cancer. Is my understanding patient has refused bone marrow biopsy at this time. I will readjust dresses with the patient prior to beginning therapy. Risks and benefits of treatment including skin reaction, alteration blood counts, and fatigue were all discussed in detail with the patient. Patient and daughter both seem to comprehend my treatment plan well.  I would like to take this opportunity to thank you for allowing me to continue to participate in this patient's care.  CC Referral:   cc: Dr. Ezequiel Kayser   Electronic Signatures: Baruch Gouty, Roda Shutters (MD)  (Signed [id number] 14:43)  Authored: HPI, Diagnosis, Past Hx, PFSH, Allergies, Home Meds, ROS, Nursing Notes, Physical Exam, Encounter Assessment and Plan, CC Referring Physician   Last Updated: 18-Nov-13 14:43 by Armstead Peaks (MD)

## 2014-05-05 NOTE — Consult Note (Signed)
Appreciate input from ortho ,medical and pain service.has been on iv bisphosphonate therapy for several months.biopsy scheduled for Mon 01/08/12.marrow done 12/19-results pending.have better idea re disease prognosis once results from bone biopsy and bone marrow available in next week.control may only be achieved with surgical stabilisation and improve quality of life even if for a limited period.continue to follow.hold Lovenox on 12/22 for bone biopsy 12/23.  Electronic Signatures: Antony Hasteamiah, Genelda Roark S (MD)  (Signed on 21-Dec-13 17:24)  Authored  Last Updated: 21-Dec-13 17:24 by Antony Hasteamiah, Jaevin Medearis S (MD)

## 2014-05-08 NOTE — Consult Note (Signed)
PATIENT NAME:  Ariel Mccoy, Ariel Mccoy MR#:  [id number] DATE OF BIRTH:  15-Mar-1923  DATE OF CONSULTATION:  01/06/2012  CONSULTING PHYSICIAN:  Boneau Sink, MD  REASON FOR CONSULTATION: Management of acute pain.   REQUESTING PHYSICIAN: Kennieth Francois, MD.  HISTORY OF PRESENT ILLNESS: The patient is a very nice 81 year old female who has a history of hypertension, left fractured shoulder, hypercholesterolemia, lumbago, anxiety, depression, vitamin D deficiency, constipation, HRT, who comes with chief complaint of severe pain over her right hip and right lower back.   Apparently this pain has been going on for a long time, maybe several years. She has been going on to her primary care physician and then referred to Pain Management for which she has had epidural injections, several MRIs. She had an MRI of the lower back about a year ago, which was overall negative for any bone disease.   She had a bone scan that showed accumulation of radio tracer through the proximal right femur including femoral head, neck, and intratrochanteric region, a lumbar spine with L3-L5 and a repeat MRI done on 09/05/2011 showed findings that were worrisome for metastatic disease.   Shortly to all this, the patient has been put on a lot of pain medications. She had significant pain, which is 10/10, mostly in her hip, occasionally on her back. She has had progression of this pain for what Dr. Kallie Edward decided to put her in the hospital to complete the metastatic work-up.   At this moment the patient had her admission on December 16, and Neurology has been consulted, Dr. Melrose Nakayama, mostly for new onset of ptosis, they recommended evaluation myasthenia gravis.   She also has a left pleural effusion that was drained with thoracentesis.   Overall her pain seems to be increasing and Dr. Kallie Edward asked [place] to participate in her care for pain control and evaluate if we can take the patient as a primary, that is the reason of  consultation.   The protein-immune electrophoresis showed a spike of the M protein.    REVIEW OF SYSTEMS: A 12-system review of systems was done.  CONSTITUTIONAL: The patient states that she has been very fatigues. She is very weak. She has increased pain on her hip and her back. She has had some weight loss but cannot quantify.  HEENT:  The patient is starting to have some ptosis which is intermittent for what she is being evaluated for myasthenia gravis and possible paraneoplastic syndrome. No tinnitus. No epistaxis.  RESPIRATORY: Positive for pain with deep respirations. The patient has a pleural effusion that has been draining, and she is having significant dyspnea to the point that she is on oxygen which was started during this hospitalization.  CARDIOVASCULAR: No chest pain. No orthopnea. No syncope.  GASTROINTESTINAL: Loss of appetite. No nausea or vomiting at this moment. No melena. Positive constipation.  GENITOURINARY: No dysuria, hematuria, but the patient had significant pain every time that she gets up to use the commode or a bedpan for what she is going to have Foley catheter.  ENDOCRINE: No polyuria, polydipsia, polyphagia. No cold or heat intolerance. HEMATOLOGIC/LYMPHATIC: The patient denies any bleeding. The patient bruises easily and has anemia.   SKIN: The patient has very delicate skin. No rashes. No petechiae.  MUSCULOSKELETAL: As mentioned above, hip pain, back pain is the main issue.  NEUROLOGICAL: No numbness, no tingling. No CVA, no TIAs.   PSYCHIATRIC: The patient has a history of anxiety and depression but at this moment is overall  stable.   PAST MEDICAL HISTORY:  1. Possible multiple myeloma.  2. Left shoulder fracture.  3. Chronic hip pain.  4. Chronic back pain.  5. Dyslipidemia.  6. Anxiety.  7. Depression.  8. Hypertension.   PAST SURGICAL HISTORY:  1. Left foot surgery.  2. Hysterectomy.  3. Appendectomy.  4. Laparoscopic cholecystectomy.   SOCIAL  HISTORY: The patient denies any tobacco or alcohol use.    FAMILY HISTORY: Positive for cancer of unknown type in her mother. No other issues.   ALLERGIES: CODEINE AND SULFA DRUGS.   CURRENT MEDICATIONS:  1. Docusate 50/8.6 mg twice a day.  2. Estradiol 0.5 mg daily.  3. HCTZ/triamterene 25/37.5 mg, take half tablet once daily.  4. Lactulose syrup 2 times a day for constipation.  5. Lidocaine patch ordered by myself. 6. Paroxetine 30 mg once daily.  7. Quinapril 40 mg once daily.  8. Lovenox 40 mg subcutaneously daily. 9. Morphine PCA, now changed to a fentanyl PCA. 10. Tylenol p.r.n. 11. Baclofen p.r.n.  12. Promethazine p.r.n.  13. Oxygen nasal cannula p.r.n.  14. A fentanyl patch of 25 mcg every 72 hours.   PHYSICAL EXAMINATION:  VITAL SIGNS: Blood pressure 127/71, pulse 86, respiratory rate 18, temperature 99.3 today.  GENERAL: The patient is alert and oriented x 3. No acute distress at this moment. The patient states she is in a lot of pain, and hemodynamically stable.  HEENT: Pupils are equal, round and reactive. Extraocular movements are intact. I do not see any ptosis of her eyes at this moment. Anicteric sclerae. Pink conjunctivae. Oral mucosa are moist. No oropharyngeal exudates. No thrush.  NECK: Supple. No JVD. No thyromegaly. No adenopathy. No carotid bruits. Normal range of motion.  CARDIOVASCULAR: Regular rate and rhythm. No murmurs, rubs, or gallops. No displacement of PMI. No tenderness to palpation of the chest.  LUNGS: Mostly clear with decreased respiratory sounds in both bases, left more than right. Positive occasional use of accessory muscles whenever patient talks, dullness to percussion of the left base.  ABDOMEN: Soft, nontender, nondistended. No hepatosplenomegaly. No masses. Bowel sounds are positive. Tenderness to palpation at the level of the right hip.  EXTREMITIES: Positive edema +1, no cyanosis, no clubbing. Pulses +2.  SKIN: No rashes or petechiae, very  fragile skin and somehow dry on the lower extremities. No rashes.  LYMPHATICS: Negative for lymphadenopathy in neck or supraclavicular area.  PSYCHIATRIC: Mood is flat affect but no agitation.  NEUROLOGIC: Cranial nerves II through XII are intact. Difficult, almost impossible, to evaluate strength due to the fact that the patient does have severe pain. Sensation is equal in both lower extremities.   GENITAL EXAM: Negative for external lesions.  MUSCULOSKELETAL: Significant pain at the level of the right hip. No other joint deformity or joint effusions.   LABORATORY DATA: BUN is 25, creatinine is 0.75. Sodium 135, calcium 7.9. White count is 7.2, hemoglobin 10, hematocrit 32, platelets 251. The patient had some previous labs, elevated, light change protein. She had a pleural effusion drained, sent for cytology, negative for malignant cells, reactive mesothelial cells are present, mostly a transudate. Her TSH was normal. Her LFTs have been slightly elevated, alk phosphatase, especially due to the bone metastasis, AST is around 42.   MRI of the pelvis: Findings consistent with areas of marrow infiltration involving the lower L3, L4, L5 vertebral bodies, consistent with multiple myeloma.   ASSESSMENT AND PLAN: An 81 year old female with possible multiple myeloma, hypertension, depression, and significant hip pain,  anxiety, hypercholesterolemia. And consulted by Dr. Kennieth Francois for control of the pain and to see if we can take the patient is a primary. We accept the patient is a primary at this moment and we are going to help controlling the pain.  1. Intractable hip pain. The patient is on a morphine PCA and fentanyl patch. I think at this moment we should simplify things since the patient is going to be using a fentanyl patch, we are going to do fentanyl PCA instead of the morphine: (1) Because the morphine is not working significantly. (2) Because of potential of accumulation of the morphine especially  if the patient goes into kidney failure, which is common in the patients with multiple myeloma, and (3) Because it will be easier to transfer the total 24 hour dose of fentanyl into a patch and that way she will be better controlled. (4) Because fentanyl is much more easily reversible then morphine is, that way if the patient starts having some altered mental status, getting sedated from medications, we can remove the fentanyl much easier.  2.  I am going to add on a Lidoderm patch to apply to the hip and see if that helps and overall, we are going to continue to work towards that. I am doing an aqueous dose of morphine based on her total requirements here in the hospital, and see if she will need a little bit more. Sometimes fentanyl works as a little bit faster and better than the morphine would. 3. Due to her severe pain, we are going to put a Foley catheter to avoid her to move too much. 4. Other than that,  we will continue to adjust medications as we go. I think the patient might benefit with the use of some more medications like Lyrica for what I am going to add on 25 mg twice a day, and then adjust it as well as we go. 4. Another medication that could benefit this patient would be Nucynta but we will start from what we have. A palliative care consult for pain control is going to be obtained. 5. Multiple myeloma. The patient presented with a history of bone metastasis, likely multiple myeloma. I cannot rule out the possibility. 6. Considering the diagnosis of Walderstrom's, maybe we could check her viscosity and blood. I will let Hematology/Oncology decide on that.  7. Hypertension. Seems to be well controlled.  8. Pleural effusion. Consider doing another thoracentesis to help her dyspnea.  9. Respiratory failure. The patient has acute respiratory failure due to pleural effusion, now requiring oxygen. Although she is stable she might get worse, consider tapping or getting some fluid out p.r.n.  10.  Deep vein thrombosis prophylaxis. Lovenox.   We accept taking care of this patient and getting her into our service.   TIME SPENT: I spent about 50 minutes reviewing her chart, talking to the patient, and addressing her acute issues.  ____________________________ [place] Sink, MD [email or web address] D: 01/05/2012 18:38:00 ET T: 01/06/2012 14:46:41 ET JOB#: [id number]  cc: Brantley Sink, MD, <Dictator> Abagael Kramm America Brown MD ELECTRONICALLY SIGNED 01/18/2012 7:44

## 2014-05-08 NOTE — Discharge Summary (Signed)
PATIENT NAME:  Ariel Mccoy, Ariel Mccoy MR#:  [id number] DATE OF BIRTH:  28-Dec-1923  DATE OF ADMISSION:  01/01/2012 DATE OF DISCHARGE:  01/12/2012  PRIMARY CARE PHYSICIAN: None.  [facility]INIC:  [facility]. Ramiah, MD  DISCHARGE DIAGNOSES:  1. Multiple myeloma. 2. Chronic cancer pain. 3. Weakness.   HISTORY OF PRESENTING ILLNESS: An 81 year old female with a history of hypertension, left fractured shoulder, hypercholesterolemia, depression, vitamin D deficiency, constipation, who came with chief complaint of severe pain over her right hip and right lower back. She had lumbar spine MRI done in August 2013, which showed at the L3-L5 level some questionable metastatic disease, and she started having very severe pain in October. She followed with Dr. Kallie Edward, and she decided to put her in the hospital to complete the metastatic workup including bone biopsy and liver biopsy. So, she was admitted on December 16th. Her pain continued increasing. She had biopsies done during the hospital stay. Pain was managed with IV fentanyl drip PCA, and she remained in the hospital for pain management, biopsy and other issues. After a few days in the stay, she was able to manage her pain with oral medications and with patch. She tolerated them very well, but she was physically very weak and was unable to walk due to the right hip pain, so Physical Therapy consult was done and they suggested to send her to the Rehab place, which we are arranging today.   OTHER MEDICAL ISSUES ADDRESSED DURING THE HOSPITAL STAY:  1. Hypertension. Hydrochlorothiazide and quinapril.  2. Depression. Continue Paxil.  3. Hyperlipidemia. Continue Zocor. 4. Acute hip pain. Occult fracture due to possible multiple myeloma, pain controlled and physical therapy and rehab.  IMPORTANT LABORATORY RESULTS IN THE HOSPITAL STAY: Hemoglobin on admission 11.2, platelet 303, BUN 26, creatinine 0.96, potassium 3.9, CO2 of 33, alkaline phosphatase 300, SGPT 18, SGOT 42,  bilirubin total 0.3. Protein electrophoresis serum immunoglobulin A elevated. Albumin low. M-spike high 0.2 g/dL. Creatinine on admission 0.79.  MRI of pelvis: Marrow infiltration lower L3, L4 and L5 vertebral bodies as well as the right hip. : An occult fracture cannot be completely excluded.   Blood culture negative. Urine culture mixed bacterial organisms suggestive of contamination. Acetylcholine receptor antibody, the result is pending. CT-guided biopsy of the liver was done. The results are pending.   CONDITION ON DISCHARGE: Stable.   CODE STATUS: DO NOT RESUSCITATE.   DISCHARGE MEDICATIONS: Aspirin 81 mg oral tablet once a day, quinapril 40 mg once a day, paroxetine 30 mg oral once a day, hydrochlorothiazide 225 mg/37.5 mg tablet 0.5 tablet once a day, promethazine 25 mg 4 to 6 hours for 10 days, estradiol 0.5 mg oral tablet once a day for 30 days, Duragesic 50 mcg per hour transdermal film extended-release, one patch transdermal every 72 hours; Percocet 1 tablet every 8 to 12 hours for 10 days, pregabalin 25 mg oral capsule 2 times a day, simvastatin 20 mg oral tablet once a day, lactulose 30 mL orally 2 times a day for 30 days and baclofen 10 mg oral tablet 0.5 mg orally every 8 hours.   HOME OXYGEN: Yes. Portable tank: No. Oxygen delivery at home 2 L nasal cannula.   DIET ON DISCHARGE: Low-sodium diet, diet consistency regular.   ACTIVITY LIMITATION: As tolerated.   REFERRAL: None.   RETURN TO WORK: Not applicable.  FOLLOW UP INSTRUCTIONS: Follow up within 1 to 2 weeks with Dr. Kallie Edward in [facility] for further management of her cancer.  She is being discharged with Foley catheter because of severe pain issues right now. Once she is more physically active, reassess the need of Foley catheter after a few days, and it can be discontinued later on.   TOTAL TIME SPENT ON DISCHARGE: 45 minutes    ____________________________ Ceasar Lund. Anselm Jungling, MD [id number] D: 01/12/2012  15:28:00 ET T: 01/12/2012 16:26:09 ET JOB#: [id number]  cc: Rae Halsted. Kallie Edward, MD Ceasar Lund Anselm Jungling, MD, <Dictator>    Vaughan Basta MD ELECTRONICALLY SIGNED 02/12/2012 8:20
